# Patient Record
Sex: Female | Born: 1955 | Race: Black or African American | Hispanic: No | Marital: Single | Smoking: Former smoker
Health system: Southern US, Community
[De-identification: ages and names within clinical notes are randomized; demographics above are authoritative.]

## PROBLEM LIST (undated history)

## (undated) DIAGNOSIS — E119 Type 2 diabetes mellitus without complications: Secondary | ICD-10-CM

## (undated) DIAGNOSIS — E079 Disorder of thyroid, unspecified: Secondary | ICD-10-CM

## (undated) DIAGNOSIS — J45909 Unspecified asthma, uncomplicated: Secondary | ICD-10-CM

## (undated) DIAGNOSIS — E7800 Pure hypercholesterolemia, unspecified: Secondary | ICD-10-CM

## (undated) HISTORY — PX: BARTHOLIN GLAND CYST EXCISION: SHX565

---

## 2018-07-09 ENCOUNTER — Emergency Department (HOSPITAL_COMMUNITY)
Admission: EM | Admit: 2018-07-09 | Discharge: 2018-07-09 | Disposition: A | Discharge status: Home / Self Care | Payer: BLUE CROSS/BLUE SHIELD | Attending: Emergency Medicine | Admitting: Emergency Medicine

## 2018-07-09 ENCOUNTER — Other Ambulatory Visit: Payer: Self-pay

## 2018-07-09 ENCOUNTER — Encounter (HOSPITAL_COMMUNITY): Payer: Self-pay | Admitting: Emergency Medicine

## 2018-07-09 DIAGNOSIS — S61411A Laceration without foreign body of right hand, initial encounter: Secondary | ICD-10-CM | POA: Insufficient documentation

## 2018-07-09 DIAGNOSIS — E119 Type 2 diabetes mellitus without complications: Secondary | ICD-10-CM | POA: Insufficient documentation

## 2018-07-09 DIAGNOSIS — Y92000 Kitchen of unspecified non-institutional (private) residence as  the place of occurrence of the external cause: Secondary | ICD-10-CM | POA: Diagnosis not present

## 2018-07-09 DIAGNOSIS — Y939 Activity, unspecified: Secondary | ICD-10-CM | POA: Insufficient documentation

## 2018-07-09 DIAGNOSIS — W268XXA Contact with other sharp object(s), not elsewhere classified, initial encounter: Secondary | ICD-10-CM | POA: Diagnosis not present

## 2018-07-09 DIAGNOSIS — Y999 Unspecified external cause status: Secondary | ICD-10-CM | POA: Insufficient documentation

## 2018-07-09 DIAGNOSIS — Z23 Encounter for immunization: Secondary | ICD-10-CM | POA: Diagnosis not present

## 2018-07-09 HISTORY — DX: Type 2 diabetes mellitus without complications: E11.9

## 2018-07-09 HISTORY — DX: Disorder of thyroid, unspecified: E07.9

## 2018-07-09 HISTORY — DX: Pure hypercholesterolemia, unspecified: E78.00

## 2018-07-09 MED ORDER — LIDOCAINE HCL (PF) 2 % IJ SOLN
INTRAMUSCULAR | Status: AC
  Administered 2018-07-09: 22:00:00
  Filled 2018-07-09: qty 10

## 2018-07-09 MED ORDER — TETANUS-DIPHTH-ACELL PERTUSSIS 5-2.5-18.5 LF-MCG/0.5 IM SUSP
0.5000 mL | Freq: Once | INTRAMUSCULAR | Status: AC
  Administered 2018-07-09: 0.5 mL via INTRAMUSCULAR
  Filled 2018-07-09: qty 0.5

## 2018-07-09 NOTE — ED Provider Notes (Signed)
[facility] EMERGENCY DEPARTMENT Provider Note   CSN: [id number] Arrival date & time: 07/09/18  2037    History   Chief Complaint Chief Complaint  Patient presents with  . Laceration    HPI Dominique Snyder is a 63 y.o. female.     The history is provided by the patient.  Laceration  Location:  Hand Hand laceration location:  R hand Length:  3 cm Depth:  Through dermis Quality: straight   Bleeding: controlled   Time since incident:  1 hour Laceration mechanism:  Metal edge (Patient was attempting to open a can of beans using an old-fashioned can opener and cut her hand.) Pain details:    Quality:  Dull   Severity:  Mild   Timing:  Constant Foreign body present:  No foreign bodies Relieved by:  Nothing Worsened by:  Movement Tetanus status:  Out of date Associated symptoms: no fever, no focal weakness, no numbness and no swelling     Past Medical History:  Diagnosis Date  . Diabetes mellitus without complication (HCC)   . High cholesterol   . Thyroid disease     There are no active problems to display for this patient.   Past Surgical History:  Procedure Laterality Date  . BARTHOLIN GLAND CYST EXCISION       OB History   No obstetric history on file.      Home Medications    Prior to Admission medications   Not on File    Family History History reviewed. No pertinent family history.  Social History Social History   Tobacco Use  . Smoking status: Never Smoker  . Smokeless tobacco: Never Used  Substance Use Topics  . Alcohol use: Never    Frequency: Never  . Drug use: Never     Allergies   Strawberry (diagnostic)   Review of Systems Review of Systems  Constitutional: Negative for chills and fever.  Skin: Positive for wound.  Neurological: Negative for focal weakness, weakness and numbness.     Physical Exam Updated Vital Signs BP (!) 179/105 (BP Location: Left Arm)   Pulse 83   Temp 98.4 F (36.9 C) (Oral)   Resp 17    Ht 5\' 6"  (1.676 m)   Wt 70.8 kg   SpO2 98%   BMI 25.18 kg/m   Physical Exam Constitutional:      Appearance: She is well-developed.  HENT:     Head: Normocephalic.  Cardiovascular:     Rate and Rhythm: Normal rate.  Pulmonary:     Effort: Pulmonary effort is normal.  Musculoskeletal:        General: No tenderness.     Comments: Full range of motion of affected fingers of right hand.  Skin:    Findings: Laceration present.     Comments: 3 cm laceration dorsal right hand involving the proximal ring finger and distal hand.  Wound is hemostatic.  It is through the dermis, no visualized deep structures.  Neurological:     Mental Status: She is alert and oriented to person, place, and time.     Sensory: No sensory deficit.     Comments: Distal sensation is intact in fingertips.      ED Treatments / Results  Labs (all labs ordered are listed, but only abnormal results are displayed) Labs Reviewed - No data to display  EKG None  Radiology No results found.  Procedures Procedures (including critical care time)  LACERATION REPAIR Performed by: Burgess Amor Authorized by:  Burgess Amor Consent: Verbal consent obtained. Risks and benefits: risks, benefits and alternatives were discussed Consent given by: patient Patient identity confirmed: provided demographic data Prepped and Draped in normal sterile fashion Wound explored  Laceration Location: right hand  Laceration Length: 3cm  No Foreign Bodies seen or palpated  Anesthesia: local infiltration  Local anesthetic: lidocaine 2% without epinephrine  Anesthetic total: 3 ml  Irrigation method: syringe Amount of cleaning: standard  Skin closure: ethilon 4-0  Number of sutures: 6  Technique: simple interrupted  Patient tolerance: Patient tolerated the procedure well with no immediate complications.   Medications Ordered in ED Medications  lidocaine (XYLOCAINE) 2 % injection (has no administration in time  range)  Tdap (BOOSTRIX) injection 0.5 mL (has no administration in time range)     Initial Impression / Assessment and Plan / ED Course  I have reviewed the triage vital signs and the nursing notes.  Pertinent labs & imaging results that were available during my care of the patient were reviewed by me and considered in my medical decision making (see chart for details).        Wound care instructions given.  Pt advised to have sutures removed in 10 days,  Return here sooner for any signs of infection including redness, swelling, worse pain or drainage of pus.     Final Clinical Impressions(s) / ED Diagnoses   Final diagnoses:  Laceration of right hand without foreign body, initial encounter    ED Discharge Orders    None       Victoriano Lain 07/09/18 2215    Eber Hong, MD 07/09/18 [phone]

## 2018-07-09 NOTE — ED Notes (Signed)
edp in room  

## 2018-07-09 NOTE — Discharge Instructions (Addendum)
Have your sutures removed in 10 days.  Keep your wound clean and dry,  Until a good scab forms - you may then wash gently twice daily with mild soap and water, but dry completely after.  Get rechecked for any sign of infection (redness,  Swelling,  Increased pain or drainage of purulent fluid). ° °

## 2018-07-09 NOTE — ED Triage Notes (Signed)
Patient states she cut her middle right finger on an old can opener tonight. Unknown last tetanus shot.

## 2019-08-02 ENCOUNTER — Ambulatory Visit: Payer: BC Managed Care – PPO | Attending: Internal Medicine

## 2019-08-02 DIAGNOSIS — Z23 Encounter for immunization: Secondary | ICD-10-CM

## 2019-08-02 NOTE — Progress Notes (Signed)
   Covid-19 Vaccination Clinic  Name:  Dominique Snyder    MRN: [id number] DOB: 10/02/1955  08/02/2019  [name] was observed post Covid-19 immunization for 15 minutes without incident. She was provided with Vaccine Information Sheet and instruction to access the V-Safe system.   [name] was instructed to call 911 with any severe reactions post vaccine: Marland Kitchen Difficulty breathing  . Swelling of face and throat  . A fast heartbeat  . A bad rash all over body  . Dizziness and weakness   Immunizations Administered    Name Date Dose VIS Date Route   Moderna COVID-19 Vaccine 08/02/2019  9:07 AM 0.5 mL 03/25/2019 Intramuscular   Manufacturer: Moderna   Lot: [id number]   NDC: 69996-722-77

## 2019-08-30 ENCOUNTER — Ambulatory Visit: Payer: BC Managed Care – PPO | Attending: Internal Medicine

## 2019-08-30 DIAGNOSIS — Z23 Encounter for immunization: Secondary | ICD-10-CM

## 2019-08-30 NOTE — Progress Notes (Signed)
   Covid-19 Vaccination Clinic  Name:  Dominique Snyder    MRN: [id number] DOB: 1956-04-11  08/30/2019  Dominique Snyder was observed post Covid-19 immunization for 15 minutes without incident. She was provided with Vaccine Information Sheet and instruction to access the V-Safe system.   Dominique Snyder was instructed to call 911 with any severe reactions post vaccine: Marland Kitchen Difficulty breathing  . Swelling of face and throat  . A fast heartbeat  . A bad rash all over body  . Dizziness and weakness   Immunizations Administered    Name Date Dose VIS Date Route   Moderna COVID-19 Vaccine 08/30/2019  9:50 AM 0.5 mL 03/2019 Intramuscular   Manufacturer: Moderna   Lot: [id number]   NDC: 89791-504-13

## 2019-10-05 ENCOUNTER — Other Ambulatory Visit: Payer: Self-pay

## 2019-10-05 ENCOUNTER — Emergency Department (HOSPITAL_COMMUNITY)
Admission: EM | Admit: 2019-10-05 | Discharge: 2019-10-06 | Disposition: A | Discharge status: Home / Self Care | Payer: BC Managed Care – PPO | Attending: Emergency Medicine | Admitting: Emergency Medicine

## 2019-10-05 ENCOUNTER — Emergency Department (HOSPITAL_COMMUNITY): Payer: BC Managed Care – PPO

## 2019-10-05 DIAGNOSIS — Z7902 Long term (current) use of antithrombotics/antiplatelets: Secondary | ICD-10-CM | POA: Insufficient documentation

## 2019-10-05 DIAGNOSIS — M549 Dorsalgia, unspecified: Secondary | ICD-10-CM | POA: Diagnosis not present

## 2019-10-05 DIAGNOSIS — R0789 Other chest pain: Secondary | ICD-10-CM | POA: Diagnosis present

## 2019-10-05 DIAGNOSIS — Z79899 Other long term (current) drug therapy: Secondary | ICD-10-CM | POA: Diagnosis not present

## 2019-10-05 DIAGNOSIS — Z794 Long term (current) use of insulin: Secondary | ICD-10-CM | POA: Insufficient documentation

## 2019-10-05 DIAGNOSIS — E119 Type 2 diabetes mellitus without complications: Secondary | ICD-10-CM | POA: Insufficient documentation

## 2019-10-05 LAB — BASIC METABOLIC PANEL
Anion gap: 10 (ref 5–15)
BUN: 14 mg/dL (ref 8–23)
CO2: 29 mmol/L (ref 22–32)
Calcium: 9.7 mg/dL (ref 8.9–10.3)
Chloride: 100 mmol/L (ref 98–111)
Creatinine, Ser: 0.74 mg/dL (ref 0.44–1.00)
GFR calc Af Amer: >60 mL/min (ref >60)
GFR calc non Af Amer: >60 mL/min (ref >60)
Glucose, Bld: 184 mg/dL — ABNORMAL HIGH (ref 70–99)
Potassium: 3.7 mmol/L (ref 3.5–5.1)
Sodium: 139 mmol/L (ref 135–145)

## 2019-10-05 LAB — TROPONIN I (HIGH SENSITIVITY): Troponin I (High Sensitivity): 7 ng/L (ref <18)

## 2019-10-05 LAB — CBC
HCT: 40.7 % (ref 36.0–46.0)
Hemoglobin: 13.3 g/dL (ref 12.0–15.0)
MCH: 28.9 pg (ref 26.0–34.0)
MCHC: 32.7 g/dL (ref 30.0–36.0)
MCV: 88.5 fL (ref 80.0–100.0)
Platelets: 335 10*3/uL (ref 150–400)
RBC: 4.6 MIL/uL (ref 3.87–5.11)
RDW: 13.9 % (ref 11.5–15.5)
WBC: 8.8 10*3/uL (ref 4.0–10.5)
nRBC: 0 % (ref 0.0–0.2)

## 2019-10-05 MED ORDER — FENTANYL CITRATE (PF) 100 MCG/2ML IJ SOLN
50.0000 ug | Freq: Once | INTRAMUSCULAR | Status: AC
  Administered 2019-10-05: 50 ug via INTRAVENOUS
  Filled 2019-10-05: qty 2

## 2019-10-05 NOTE — ED Triage Notes (Signed)
Patient states chest pain started today while she was at home and states pain radiates to her arm and back.

## 2019-10-06 ENCOUNTER — Emergency Department (HOSPITAL_COMMUNITY): Payer: BC Managed Care – PPO

## 2019-10-06 LAB — TROPONIN I (HIGH SENSITIVITY): Troponin I (High Sensitivity): 6 ng/L (ref <18)

## 2019-10-06 MED ORDER — IOHEXOL 350 MG/ML SOLN
100.0000 mL | Freq: Once | INTRAVENOUS | Status: AC | PRN
  Administered 2019-10-06: 100 mL via INTRAVENOUS

## 2019-10-06 MED ORDER — SIMETHICONE 80 MG PO CHEW
80.0000 mg | CHEWABLE_TABLET | Freq: Once | ORAL | Status: AC
  Administered 2019-10-06: 80 mg via ORAL
  Filled 2019-10-06: qty 1

## 2019-10-06 NOTE — ED Provider Notes (Signed)
[facility] EMERGENCY DEPARTMENT Provider Note   CSN: [id number] Arrival date & time: 10/05/19  2208   Time seen 11:40  Chief Complaint  Patient presents with  . Chest Pain    Dominique Snyder is a 64 y.o. female.  HPI     Patient states this evening about 8 PM she was watching TV and she started getting pain in her lower central chest that radiates into her upper back bilaterally.  She describes the discomfort as tightness.  She states sometimes changing positions such as leaning forward makes it hurt more.  However if she leans forward and lays across a pillow that makes it feel better.  The pain is been there constantly although it is better now, she states it is currently a 7/10.  She denies shortness of breath but states she had nausea and some diaphoresis without vomiting.  The pain does not radiate into her arms.  She states she had it about a week ago and thought it was from gas.  She states she walked around until she had flatus and it felt better.  She tried that tonight without improvement.  She states she started having a cough with some white mucus production after the pain started.  She denies fever, sore throat, or rhinorrhea.  She has a mild frontal headache that she describes as tension.  She denies abdominal bloating or acid reflux symptoms such as burning fluid in her throat.  She states for dinner tonight she ate pork chops, lima beans, mashed potatoes and gravy, and cornbread.  For family history she states her maternal grandmother had an MI, her maternal aunt had chest pains.  Patient has history of diabetes and high cholesterol but denies hypertension.  She does not smoke.  PCP Lenoria Chime, FNP     Past Medical History:  Diagnosis Date  . Diabetes mellitus without complication (HCC)   . High cholesterol   . Thyroid disease     There are no problems to display for this patient.   Past Surgical History:  Procedure Laterality Date  . BARTHOLIN GLAND CYST  EXCISION       OB History   No obstetric history on file.     No family history on file.  Social History   Tobacco Use  . Smoking status: Never Smoker  . Smokeless tobacco: Never Used  Substance Use Topics  . Alcohol use: Never  . Drug use: Never  employed in factory  Home Medications Prior to Admission medications   Medication Sig Start Date End Date Taking? Authorizing Provider  atorvastatin (LIPITOR) 20 MG tablet atorvastatin 20 mg tablet   Yes [provider]  insulin glargine (LANTUS SOLOSTAR) 100 UNIT/ML Solostar Pen Lantus Solostar U-100 Insulin 100 unit/mL (3 mL) subcutaneous pen  INJECT 10 UNITS ONCE A DAY SUBCUTANEOUS   Yes [provider]  levothyroxine (SYNTHROID) 100 MCG tablet levothyroxine 100 mcg tablet   Yes [provider]  metFORMIN (GLUCOPHAGE) 500 MG tablet metformin 500 mg tablet   Yes [provider]  nabumetone (RELAFEN) 750 MG tablet nabumetone 750 mg tablet   Yes [provider]  omeprazole (PRILOSEC) 20 MG capsule omeprazole 20 mg capsule,delayed release   Yes [provider]  sitaGLIPtin (JANUVIA) 100 MG tablet Januvia 100 mg tablet  TAKE 1 TABLET BY MOUTH EVERY DAY   Yes [provider]  albuterol (VENTOLIN HFA) 108 (90 Base) MCG/ACT inhaler albuterol sulfate HFA 90 mcg/actuation aerosol inhaler  [provider]    Allergies    Strawberry (diagnostic)  Review of Systems   Review of Systems  All other systems reviewed and are negative.   Physical Exam Updated Vital Signs BP 130/83   Pulse 65   Temp 98.5 F (36.9 C) (Oral)   Resp 12   Ht 5\' 6"  (1.676 m)   Wt 82.6 kg   SpO2 97%   BMI 29.38 kg/m   Physical Exam Vitals and nursing note reviewed.  Constitutional:      Appearance: Normal appearance. She is normal weight.     Comments: Watching TV in no distress  HENT:     Head: Normocephalic and atraumatic.     Right Ear: External ear normal.     Left Ear:  External ear normal.     Nose: Nose normal.  Eyes:     Extraocular Movements: Extraocular movements intact.     Conjunctiva/sclera: Conjunctivae normal.     Pupils: Pupils are equal, round, and reactive to light.  Cardiovascular:     Rate and Rhythm: Normal rate.     Pulses: Normal pulses.     Heart sounds: No murmur heard.   Pulmonary:     Effort: Pulmonary effort is normal. No respiratory distress.     Breath sounds: Normal breath sounds.  Chest:     Chest wall: Tenderness present.    Abdominal:     General: Abdomen is flat. Bowel sounds are normal.     Palpations: Abdomen is soft.  Musculoskeletal:        General: Normal range of motion.     Cervical back: Normal range of motion.       Back:     Comments: Area that her back pain radiates to is noted however it is nontender.  Skin:    General: Skin is warm and dry.  Neurological:     General: No focal deficit present.     Mental Status: She is alert and oriented to person, place, and time.     Cranial Nerves: No cranial nerve deficit.  Psychiatric:        Mood and Affect: Mood normal.        Behavior: Behavior normal.        Thought Content: Thought content normal.     ED Results / Procedures / Treatments   Labs (all labs ordered are listed, but only abnormal results are displayed) Results for orders placed or performed during the hospital encounter of 10/05/19  CBC  Result Value Ref Range   WBC 8.8 4.0 - 10.5 K/uL   RBC 4.60 3.87 - 5.11 MIL/uL   Hemoglobin 13.3 12.0 - 15.0 g/dL   HCT 40.7 36 - 46 %   MCV 88.5 80.0 - 100.0 fL   MCH 28.9 26.0 - 34.0 pg   MCHC 32.7 30.0 - 36.0 g/dL   RDW 13.9 11.5 - 15.5 %   Platelets 335 150 - 400 K/uL   nRBC 0.0 0.0 - 0.2 %  Basic metabolic panel  Result Value Ref Range   Sodium 139 135 - 145 mmol/L   Potassium 3.7 3.5 - 5.1 mmol/L   Chloride 100 98 - 111 mmol/L   CO2 29 22 - 32 mmol/L   Glucose, Bld 184 (H) 70 - 99 mg/dL   BUN 14 8 - 23 mg/dL   Creatinine, Ser 0.74  0.44 - 1.00 mg/dL   Calcium 9.7 8.9 - 10.3 mg/dL   GFR calc non Af Amer >  60 >60 mL/min   GFR calc Af Amer >60 >60 mL/min   Anion gap 10 5 - 15  Troponin I (High Sensitivity)  Result Value Ref Range   Troponin I (High Sensitivity) 7 <18 ng/L  Troponin I (High Sensitivity)  Result Value Ref Range   Troponin I (High Sensitivity) 6 <18 ng/L   Laboratory interpretation all normal with negative delta troponin    EKG EKG Interpretation  Date/Time:  Sunday October 05 2019 22:23:28 EDT Ventricular Rate:  78 PR Interval:    QRS Duration: 87 QT Interval:  398 QTC Calculation: 454 R Axis:   62 Text Interpretation: Sinus rhythm Electrode noise Baseline wander Poor R wave progression No old tracing to compare Confirmed by Devoria Albe ([id number]) on 10/05/2019 11:07:13 PM   Radiology CT Angio Chest PE W/Cm &/Or Wo Cm  Result Date: 10/06/2019 CLINICAL DATA:  Chest pain EXAM: CT ANGIOGRAPHY CHEST WITH CONTRAST TECHNIQUE: Multidetector CT imaging of the chest was performed using the standard protocol during bolus administration of intravenous contrast. Multiplanar CT image reconstructions and MIPs were obtained to evaluate the vascular anatomy. CONTRAST:  OMNIPAQUE IOHEXOL 350 MG/ML SOLN COMPARISON:  Chest x-ray 10/05/2019 FINDINGS: Cardiovascular: No filling defects in the pulmonary arteries to suggest pulmonary emboli. Heart is normal size. Aorta is normal caliber. Mediastinum/Nodes: No mediastinal, hilar, or axillary adenopathy. Trachea and esophagus are unremarkable. Thyroid unremarkable. Lungs/Pleura: Lungs are clear. No focal airspace opacities or suspicious nodules. No effusions. Upper Abdomen: Low-density lesion with peripheral contrast puddling in the right hepatic lobe compatible with hemangioma measuring 2.9 cm. No acute findings. Musculoskeletal: Chest wall soft tissues are unremarkable. No acute bony abnormality. Review of the MIP images confirms the above findings. IMPRESSION: No evidence of  pulmonary embolus. No acute cardiopulmonary disease. 2.9 cm right hepatic hemangioma. Electronically Signed   By: Charlett Nose M.D.   On: 10/06/2019 00:37   DG Chest Portable 1 View  Result Date: 10/05/2019 CLINICAL DATA:  Chest pain EXAM: PORTABLE CHEST 1 VIEW COMPARISON:  None. FINDINGS: The heart size and mediastinal contours are within normal limits. Both lungs are clear. The visualized skeletal structures are unremarkable. IMPRESSION: No active disease. Electronically Signed   By: Katherine Mantle M.D.   On: 10/05/2019 23:11    Procedures Procedures (including critical care time)  Medications Ordered in ED Medications  simethicone (MYLICON) chewable tablet 80 mg (has no administration in time range)  fentaNYL (SUBLIMAZE) injection 50 mcg (50 mcg Intravenous Given 10/05/19 2354)  iohexol (OMNIPAQUE) 350 MG/ML injection 100 mL (100 mLs Intravenous Contrast Given 10/06/19 0018)    ED Course  I have reviewed the triage vital signs and the nursing notes.  Pertinent labs & imaging results that were available during my care of the patient were reviewed by me and considered in my medical decision making (see chart for details).    MDM Rules/Calculators/A&P                          Patient was given fentanyl for her pain.  Due to her having chest pain that radiates into her back CTA was done.  Recheck after patient CT had resulted.  Patient is watching TV in no distress.  She states she still has pain after the fentanyl.  She was given Mylicon for gas.  We discussed increasing her omeprazole to twice a day for the next 2 weeks and then dropping back down to once a day.  At this  point her pain is atypical, she has ruled out for an acute myocardial ischemia tonight.  She should be followed up by her primary care doctor if she continues to have problems and she may need GI referral.  Final Clinical Impression(s) / ED Diagnoses Final diagnoses:  Atypical chest pain    Rx / DC Orders ED  Discharge Orders    None    OTC gaviscon or maalox  Plan discharge  Devoria Albe, MD, Concha Pyo, MD 10/06/19 0330

## 2019-10-06 NOTE — Discharge Instructions (Addendum)
Increase your omeprazole to twice a day for the next 2 weeks then drop it back down to once a day.  Try to avoid eating fried, spicy, or greasy foods for the next couple of weeks.  If you continue to get the pain, try taking Maalox or Gaviscon for the discomfort.  You might need to see a gastroenterologist if you continue to have the pain.  Return to the emergency department if your pain gets severe.

## 2020-08-13 IMAGING — CT CT ANGIO CHEST
2 of 6 series · 19 of 46 positions shown · IV contrast (omnipaque)
Comparison: Chest x-ray 10/05/2019

CLINICAL DATA: Chest pain

EXAM:
CT ANGIOGRAPHY CHEST WITH CONTRAST
TECHNIQUE: Multidetector CT imaging of the chest was performed using the
standard protocol during bolus administration of intravenous
contrast. Multiplanar CT image reconstructions and MIPs were
obtained to evaluate the vascular anatomy.
CONTRAST:  100mL OMNIPAQUE IOHEXOL 350 MG/ML SOLN

[Series 5: pe axial thins · axial · 0.67mm/px · z∈[-319,-67]mm · 16 of 278 slices shown]
[im 13/278  lung]
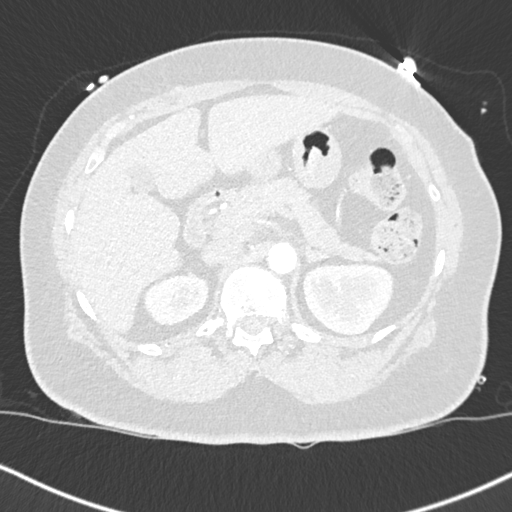
[im 37/278  soft-tissue]
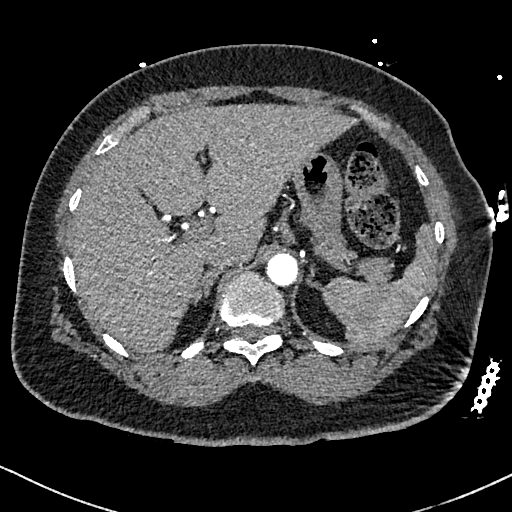
[im 49/278  lung]
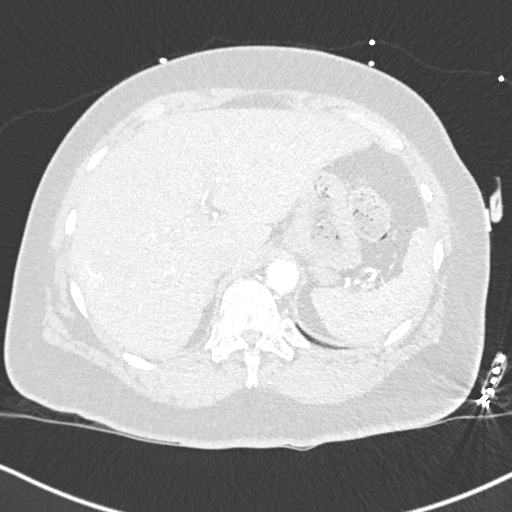
[im 61/278  soft-tissue]
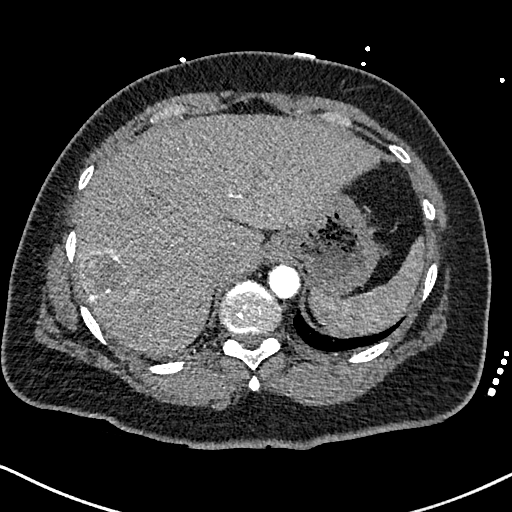
[im 85/278  lung]
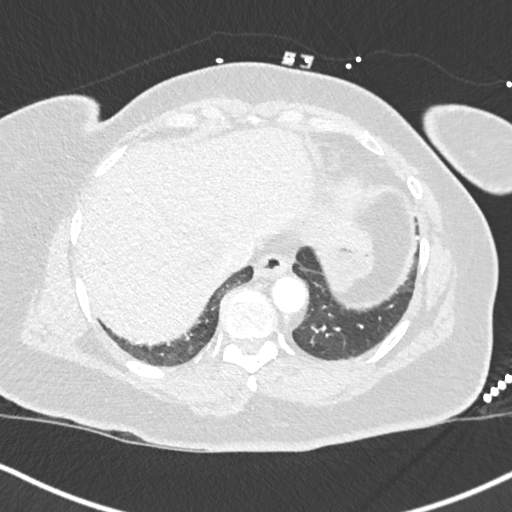
[im 97/278  soft-tissue]
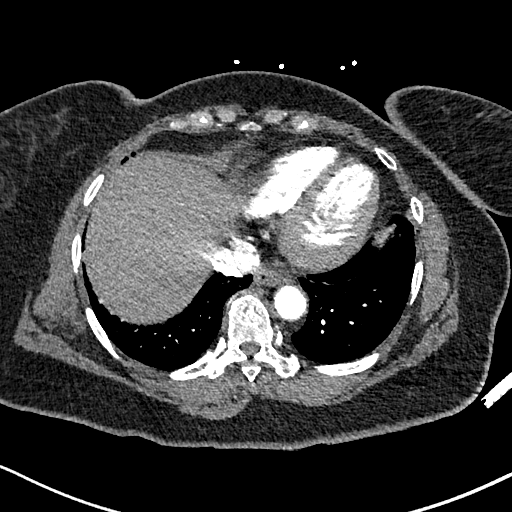
[im 109/278  lung]
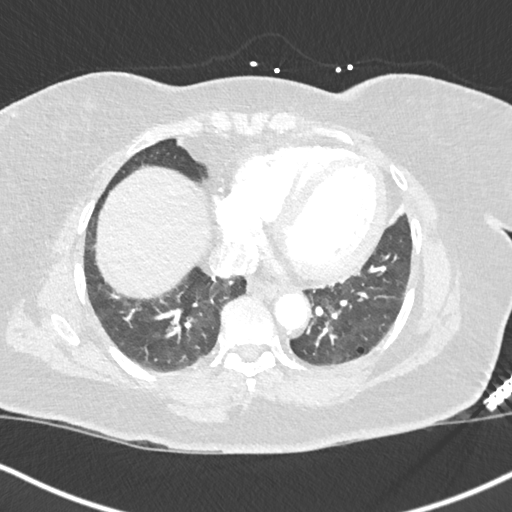
[im 133/278  soft-tissue]
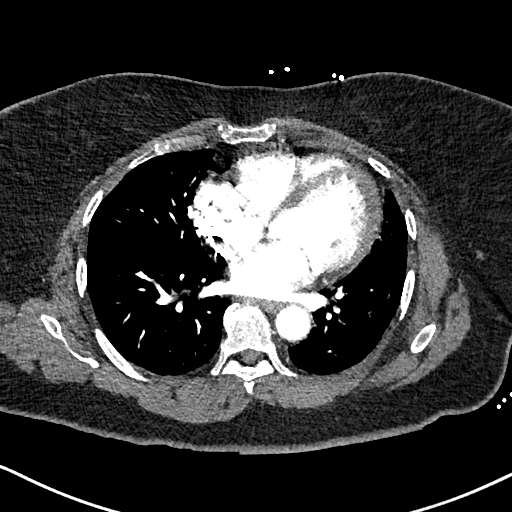
[im 145/278  lung]
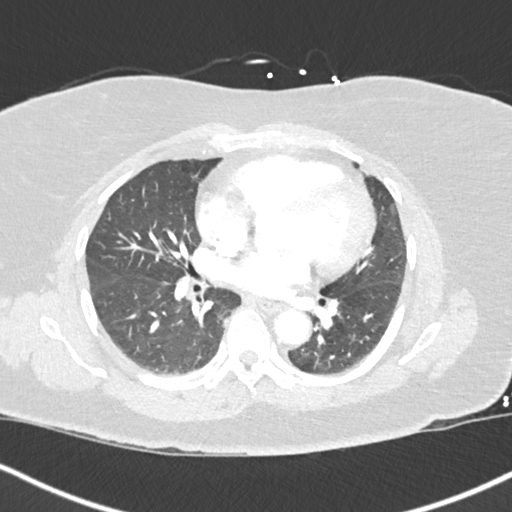
[im 169/278  soft-tissue]
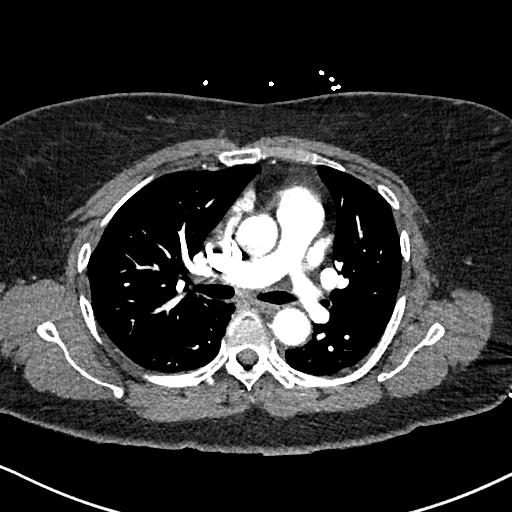
[im 181/278  lung]
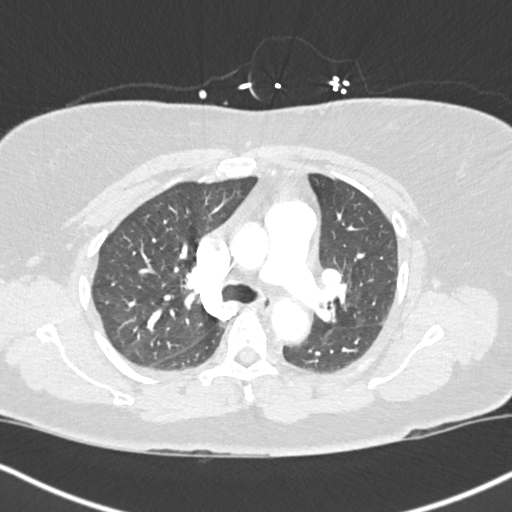
[im 193/278  soft-tissue]
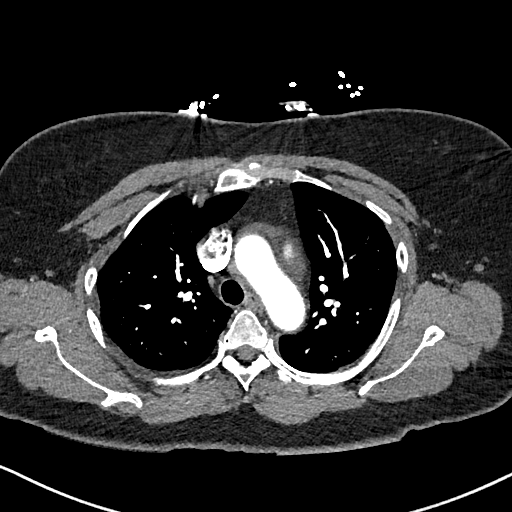
[im 217/278  lung]
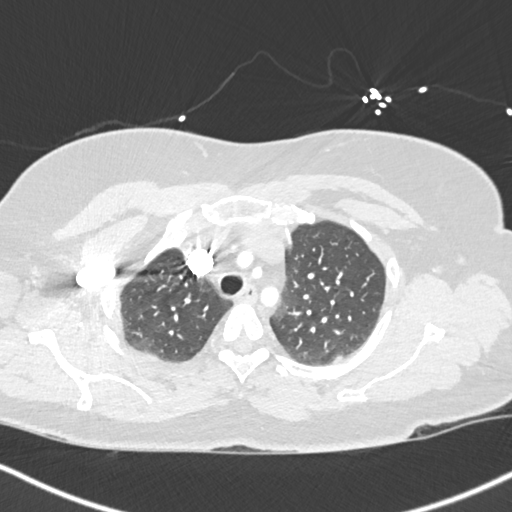
[im 229/278  soft-tissue]
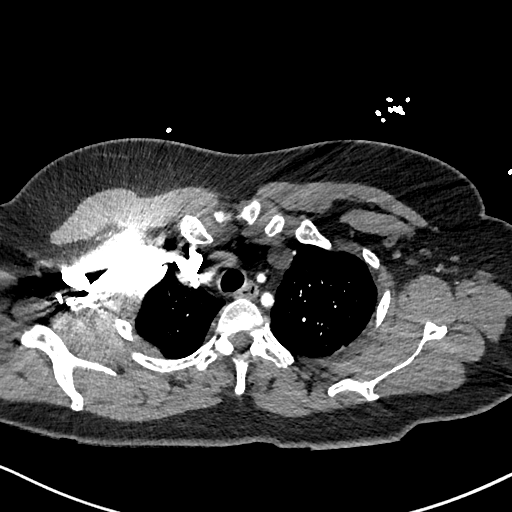
[im 241/278  lung]
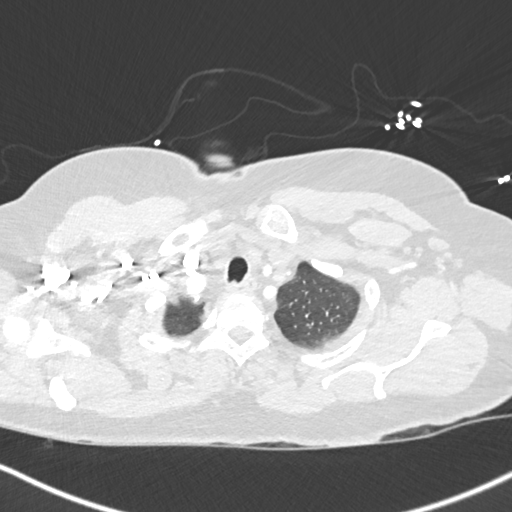
[im 265/278  soft-tissue]
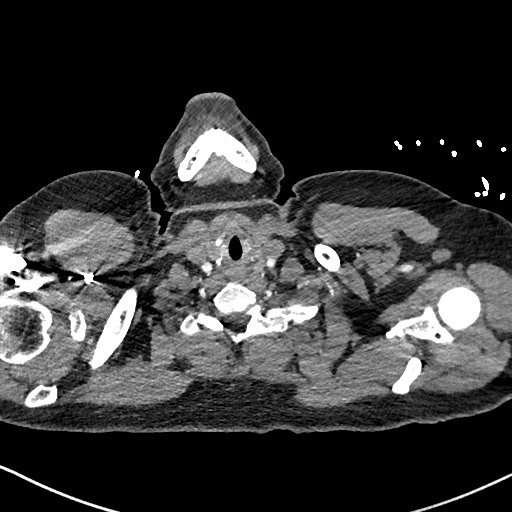

[Series 7: cor soft · coronal · 0.56mm/px · 3 of 137 slices shown]
[im 35/137  soft-tissue]
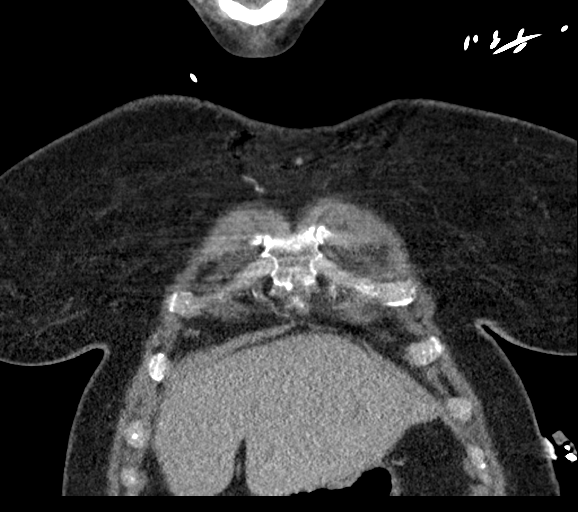
[im 69/137  soft-tissue]
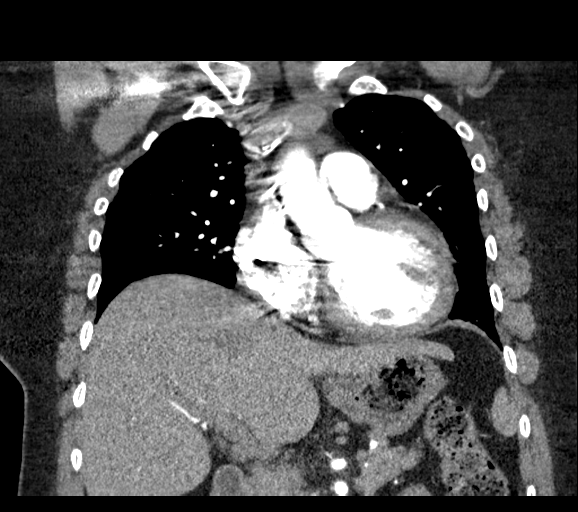
[im 103/137  soft-tissue]
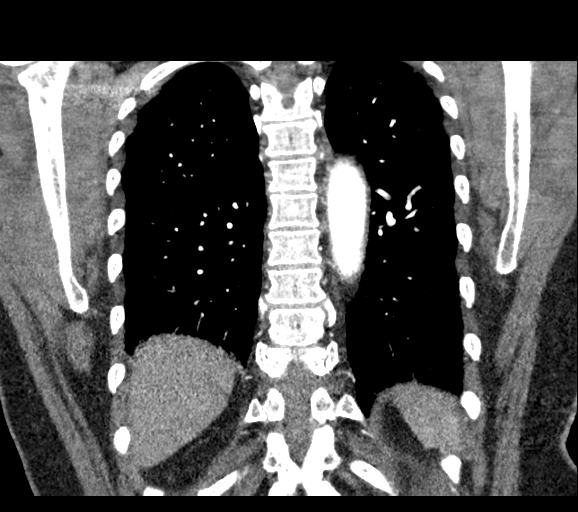

[19 of 46 positions shown; findings below may reference images not displayed]

FINDINGS: Cardiovascular: No filling defects in the pulmonary arteries to
suggest pulmonary emboli. Heart is normal size. Aorta is normal
caliber.

Mediastinum/Nodes: No mediastinal, hilar, or axillary adenopathy.
Trachea and esophagus are unremarkable. Thyroid unremarkable.

Lungs/Pleura: Lungs are clear. No focal airspace opacities or
suspicious nodules. No effusions.

Upper Abdomen: Low-density lesion with peripheral contrast puddling
in the right hepatic lobe compatible with hemangioma measuring
cm. No acute findings.

Musculoskeletal: Chest wall soft tissues are unremarkable. No acute
bony abnormality.

Review of the MIP images confirms the above findings.
IMPRESSION: No evidence of pulmonary embolus.

No acute cardiopulmonary disease.

2.9 cm right hepatic hemangioma.

## 2021-03-13 ENCOUNTER — Other Ambulatory Visit: Payer: Self-pay

## 2021-03-13 ENCOUNTER — Emergency Department (HOSPITAL_COMMUNITY)
Admission: EM | Admit: 2021-03-13 | Discharge: 2021-03-13 | Disposition: A | Discharge status: Home / Self Care | Payer: BC Managed Care – PPO | Attending: Emergency Medicine | Admitting: Emergency Medicine

## 2021-03-13 DIAGNOSIS — Z794 Long term (current) use of insulin: Secondary | ICD-10-CM | POA: Diagnosis not present

## 2021-03-13 DIAGNOSIS — U071 COVID-19: Secondary | ICD-10-CM | POA: Insufficient documentation

## 2021-03-13 DIAGNOSIS — R059 Cough, unspecified: Secondary | ICD-10-CM | POA: Diagnosis present

## 2021-03-13 DIAGNOSIS — E119 Type 2 diabetes mellitus without complications: Secondary | ICD-10-CM | POA: Insufficient documentation

## 2021-03-13 DIAGNOSIS — R040 Epistaxis: Secondary | ICD-10-CM | POA: Insufficient documentation

## 2021-03-13 DIAGNOSIS — Z7984 Long term (current) use of oral hypoglycemic drugs: Secondary | ICD-10-CM | POA: Diagnosis not present

## 2021-03-13 LAB — RESP PANEL BY RT-PCR (FLU A&B, COVID) ARPGX2
Influenza A by PCR: NEGATIVE
Influenza B by PCR: NEGATIVE
SARS Coronavirus 2 by RT PCR: POSITIVE — AB

## 2021-03-13 MED ORDER — LIDOCAINE VISCOUS HCL 2 % MT SOLN
15.0000 mL | Freq: Once | OROMUCOSAL | Status: AC
  Administered 2021-03-13: 15 mL via OROMUCOSAL
  Filled 2021-03-13: qty 15

## 2021-03-13 NOTE — ED Triage Notes (Signed)
Sore throat and nasal congestion with intermittent nose bleeds x 1 day. Nose currently not bleeding.

## 2021-03-13 NOTE — ED Provider Notes (Signed)
[facility] EMERGENCY DEPARTMENT Provider Note   CSN: [id number] Arrival date & time: 03/13/21  2024     History Chief Complaint  Patient presents with   Epistaxis   Sore Throat    Sherrol Vicars is a 65 y.o. female.  With past medical history of diabetes who presents emergency department with sore throat and epistaxis.  She states that beginning Tuesday she began having sore throat, nonproductive cough and nasal congestion.  She states that on Thursday she began having a nosebleed and it has been intermittent since then.  She denies any fevers, chest pain or shortness of breath, palpitations, nausea, vomiting or diarrhea..  She states that her son was sick last week but is unsure if he was diagnosed with flu, COVID or other.   Epistaxis Associated symptoms: congestion, cough and sore throat   Associated symptoms: no fever   Sore Throat Pertinent negatives include no chest pain and no shortness of breath.      Past Medical History:  Diagnosis Date   Diabetes mellitus without complication (HCC)    High cholesterol    Thyroid disease     There are no problems to display for this patient.   Past Surgical History:  Procedure Laterality Date   BARTHOLIN GLAND CYST EXCISION       OB History   No obstetric history on file.     No family history on file.  Social History   Tobacco Use   Smoking status: Never   Smokeless tobacco: Never  Substance Use Topics   Alcohol use: Never   Drug use: Never    Home Medications Prior to Admission medications   Medication Sig Start Date End Date Taking? Authorizing Provider  albuterol (VENTOLIN HFA) 108 (90 Base) MCG/ACT inhaler albuterol sulfate HFA 90 mcg/actuation aerosol inhaler    [provider]  atorvastatin (LIPITOR) 20 MG tablet atorvastatin 20 mg tablet    [provider]  insulin glargine (LANTUS SOLOSTAR) 100 UNIT/ML Solostar Pen Lantus Solostar U-100 Insulin 100 unit/mL (3 mL) subcutaneous  pen  INJECT 10 UNITS ONCE A DAY SUBCUTANEOUS    [provider]  levothyroxine (SYNTHROID) 100 MCG tablet levothyroxine 100 mcg tablet    [provider]  metFORMIN (GLUCOPHAGE) 500 MG tablet metformin 500 mg tablet    [provider]  nabumetone (RELAFEN) 750 MG tablet nabumetone 750 mg tablet    [provider]  omeprazole (PRILOSEC) 20 MG capsule omeprazole 20 mg capsule,delayed release    [provider]  sitaGLIPtin (JANUVIA) 100 MG tablet Januvia 100 mg tablet  TAKE 1 TABLET BY MOUTH EVERY DAY    [provider]    Allergies    Strawberry (diagnostic)  Review of Systems   Review of Systems  Constitutional:  Negative for fever.  HENT:  Positive for congestion, nosebleeds, rhinorrhea and sore throat.   Respiratory:  Positive for cough. Negative for shortness of breath.   Cardiovascular:  Negative for chest pain.  Gastrointestinal:  Negative for nausea and vomiting.   Physical Exam Updated Vital Signs BP (!) 158/89   Pulse (!) 119   Temp 99.3 F (37.4 C) (Oral)   Ht 5\' 6"  (1.676 m)   Wt 82.6 kg   SpO2 94%   BMI 29.38 kg/m   Physical Exam Vitals and nursing note reviewed.  Constitutional:      General: She is not in acute distress.    Appearance: She is well-developed. She is not ill-appearing or toxic-appearing.  HENT:     Head: Normocephalic and atraumatic.     Nose: Congestion present.     Mouth/Throat:     Mouth: Mucous membranes are moist.     Pharynx: Uvula midline. Posterior oropharyngeal erythema present. No uvula swelling.     Tonsils: No tonsillar exudate or tonsillar abscesses.  Eyes:     General: No scleral icterus.    Conjunctiva/sclera: Conjunctivae normal.  Cardiovascular:     Rate and Rhythm: Normal rate.     Heart sounds: Normal heart sounds. No murmur heard. Pulmonary:     Effort: Pulmonary effort is normal. No respiratory distress.     Breath sounds: Normal breath sounds.  Abdominal:      Palpations: Abdomen is soft.  Musculoskeletal:     Cervical back: Normal range of motion and neck supple.  Lymphadenopathy:     Cervical: Cervical adenopathy present.  Skin:    General: Skin is warm and dry.     Capillary Refill: Capillary refill takes less than 2 seconds.     Findings: No rash.  Neurological:     General: No focal deficit present.     Mental Status: She is alert.  Psychiatric:        Mood and Affect: Mood normal.        Behavior: Behavior normal.        Thought Content: Thought content normal.        Judgment: Judgment normal.    ED Results / Procedures / Treatments   Labs (all labs ordered are listed, but only abnormal results are displayed) Labs Reviewed  RESP PANEL BY RT-PCR (FLU A&B, COVID) ARPGX2 - Abnormal; Notable for the following components:      Result Value   SARS Coronavirus 2 by RT PCR POSITIVE (*)    All other components within normal limits   EKG None  Radiology No results found.  Procedures Procedures   Medications Ordered in ED Medications  lidocaine (XYLOCAINE) 2 % viscous mouth solution 15 mL (15 mLs Mouth/Throat Given 03/13/21 2206)    ED Course  I have reviewed the triage vital signs and the nursing notes.  Pertinent labs & imaging results that were available during my care of the patient were reviewed by me and considered in my medical decision making (see chart for details).    MDM Rules/Calculators/A&P 65 year old female who presents emergency department with viral upper respiratory symptoms and epistaxis.  She currently does not have a nosebleed on presentation to the emergency department or on my exam.  There does appear to be a left nare turbinate that is swollen, likely from viral upper respiratory illness.  COVID-positive. The patient is nontoxic-appearing and not in need of emergent medical intervention at this time.  She is told to self isolate at home and follow CDC guidelines for quarantine and when to return  back to work.  Given work note.  She is instructed to use Tylenol and ibuprofen as needed for fever and myalgias.  She may use over-the-counter cough and cold medication for symptomatic relief of symptoms.  She verbalizes understanding.  Safe for discharge Final Clinical Impression(s) / ED Diagnoses Final diagnoses:  COVID-19    Rx / DC Orders ED Discharge Orders     None        Cristopher Peru, PA-C 03/14/21 0002    Bethann Berkshire, MD 03/14/21 1115

## 2021-03-13 NOTE — Discharge Instructions (Addendum)
You are seen in the emergency department today for congestion, cough and nosebleed.  You are diagnosed with COVID-19.  Please quarantine at home.  You may use Tylenol and ibuprofen as needed for fever.  He can use over-the-counter cough and cold medications for relief of your symptoms.  Please return if you have shortness of breath.

## 2021-06-20 ENCOUNTER — Emergency Department (HOSPITAL_COMMUNITY)
Admission: EM | Admit: 2021-06-20 | Discharge: 2021-06-21 | Disposition: A | Discharge status: Home / Self Care | Payer: BC Managed Care – PPO | Attending: Emergency Medicine | Admitting: Emergency Medicine

## 2021-06-20 ENCOUNTER — Encounter (HOSPITAL_COMMUNITY): Payer: Self-pay

## 2021-06-20 ENCOUNTER — Other Ambulatory Visit: Payer: Self-pay

## 2021-06-20 ENCOUNTER — Emergency Department (HOSPITAL_COMMUNITY): Payer: BC Managed Care – PPO

## 2021-06-20 DIAGNOSIS — Z7951 Long term (current) use of inhaled steroids: Secondary | ICD-10-CM | POA: Diagnosis not present

## 2021-06-20 DIAGNOSIS — E876 Hypokalemia: Secondary | ICD-10-CM | POA: Insufficient documentation

## 2021-06-20 DIAGNOSIS — Z7984 Long term (current) use of oral hypoglycemic drugs: Secondary | ICD-10-CM | POA: Insufficient documentation

## 2021-06-20 DIAGNOSIS — Z8616 Personal history of COVID-19: Secondary | ICD-10-CM | POA: Insufficient documentation

## 2021-06-20 DIAGNOSIS — J45901 Unspecified asthma with (acute) exacerbation: Secondary | ICD-10-CM | POA: Insufficient documentation

## 2021-06-20 DIAGNOSIS — R0602 Shortness of breath: Secondary | ICD-10-CM | POA: Diagnosis present

## 2021-06-20 DIAGNOSIS — Z20822 Contact with and (suspected) exposure to covid-19: Secondary | ICD-10-CM | POA: Diagnosis not present

## 2021-06-20 DIAGNOSIS — E119 Type 2 diabetes mellitus without complications: Secondary | ICD-10-CM | POA: Insufficient documentation

## 2021-06-20 DIAGNOSIS — Z794 Long term (current) use of insulin: Secondary | ICD-10-CM | POA: Diagnosis not present

## 2021-06-20 LAB — CBC
HCT: 39.5 % (ref 36.0–46.0)
Hemoglobin: 12.6 g/dL (ref 12.0–15.0)
MCH: 29.4 pg (ref 26.0–34.0)
MCHC: 31.9 g/dL (ref 30.0–36.0)
MCV: 92.3 fL (ref 80.0–100.0)
Platelets: 361 10*3/uL (ref 150–400)
RBC: 4.28 MIL/uL (ref 3.87–5.11)
RDW: 15.5 % (ref 11.5–15.5)
WBC: 11.3 10*3/uL — ABNORMAL HIGH (ref 4.0–10.5)
nRBC: 0 % (ref 0.0–0.2)

## 2021-06-20 LAB — BASIC METABOLIC PANEL
Anion gap: 9 (ref 5–15)
BUN: 12 mg/dL (ref 8–23)
CO2: 22 mmol/L (ref 22–32)
Calcium: 9.4 mg/dL (ref 8.9–10.3)
Chloride: 107 mmol/L (ref 98–111)
Creatinine, Ser: 0.86 mg/dL (ref 0.44–1.00)
GFR, Estimated: >60 mL/min (ref >60)
Glucose, Bld: 124 mg/dL — ABNORMAL HIGH (ref 70–99)
Potassium: 3.4 mmol/L — ABNORMAL LOW (ref 3.5–5.1)
Sodium: 138 mmol/L (ref 135–145)

## 2021-06-20 LAB — RESP PANEL BY RT-PCR (FLU A&B, COVID) ARPGX2
Influenza A by PCR: NEGATIVE
Influenza B by PCR: NEGATIVE
SARS Coronavirus 2 by RT PCR: NEGATIVE

## 2021-06-20 MED ORDER — METHYLPREDNISOLONE SODIUM SUCC 125 MG IJ SOLR
125.0000 mg | Freq: Once | INTRAMUSCULAR | Status: AC
  Administered 2021-06-20: 125 mg via INTRAVENOUS
  Filled 2021-06-20: qty 2

## 2021-06-20 MED ORDER — ALBUTEROL (5 MG/ML) CONTINUOUS INHALATION SOLN: 2.5000 mg/h | INHALATION_SOLUTION | RESPIRATORY_TRACT | Status: DC

## 2021-06-20 MED ORDER — ALBUTEROL SULFATE (2.5 MG/3ML) 0.083% IN NEBU
INHALATION_SOLUTION | RESPIRATORY_TRACT | Status: AC
  Administered 2021-06-20: 10 mg
  Filled 2021-06-20: qty 12

## 2021-06-20 MED ORDER — HYDROCOD POLI-CHLORPHE POLI ER 10-8 MG/5ML PO SUER
5.0000 mL | Freq: Once | ORAL | Status: AC
  Administered 2021-06-20: 5 mL via ORAL
  Filled 2021-06-20: qty 5

## 2021-06-20 MED ORDER — IPRATROPIUM-ALBUTEROL 0.5-2.5 (3) MG/3ML IN SOLN
3.0000 mL | Freq: Once | RESPIRATORY_TRACT | Status: AC
  Administered 2021-06-20: 3 mL via RESPIRATORY_TRACT
  Filled 2021-06-20: qty 3

## 2021-06-20 MED ORDER — MAGNESIUM SULFATE 2 GM/50ML IV SOLN
2.0000 g | Freq: Once | INTRAVENOUS | Status: AC
  Administered 2021-06-20: 2 g via INTRAVENOUS
  Filled 2021-06-20: qty 50

## 2021-06-20 MED ORDER — ALBUTEROL SULFATE (2.5 MG/3ML) 0.083% IN NEBU
5.0000 mg | INHALATION_SOLUTION | Freq: Once | RESPIRATORY_TRACT | Status: AC
  Administered 2021-06-20: 5 mg via RESPIRATORY_TRACT
  Filled 2021-06-20: qty 6

## 2021-06-20 MED ORDER — ALBUTEROL SULFATE HFA 108 (90 BASE) MCG/ACT IN AERS: INHALATION_SPRAY | RESPIRATORY_TRACT | 1 refills | Status: DC

## 2021-06-20 NOTE — ED Provider Notes (Cosign Needed)
[facility] EMERGENCY DEPARTMENT Provider Note   CSN: [id number] Arrival date & time: 06/20/21  1756     History  Chief Complaint  Patient presents with   Shortness of Breath    Dominique Snyder is a 66 y.o. female with a past medical history of type 2 diabetes and asthma presenting today due to shortness of breath.  She reports that for around a month she has felt short of breath however it acutely worsened today.  Says she is unable to catch her breath.  Denies chest pain, palpitations, dizziness, history of DVT, recent travel or surgery.  Does not smoke cigarettes.  Reports that she has had baseline breathing problems since she had COVID-19 last year.  Has been using her albuterol inhaler nearly every 20 minutes for the past weeks.  No recent illness, fever, chills, cough or other concern.    Home Medications Prior to Admission medications   Medication Sig Start Date End Date Taking? Authorizing Provider  albuterol (VENTOLIN HFA) 108 (90 Base) MCG/ACT inhaler albuterol sulfate HFA 90 mcg/actuation aerosol inhaler    [provider]  atorvastatin (LIPITOR) 20 MG tablet atorvastatin 20 mg tablet    [provider]  insulin glargine (LANTUS SOLOSTAR) 100 UNIT/ML Solostar Pen Lantus Solostar U-100 Insulin 100 unit/mL (3 mL) subcutaneous pen  INJECT 10 UNITS ONCE A DAY SUBCUTANEOUS    [provider]  levothyroxine (SYNTHROID) 100 MCG tablet levothyroxine 100 mcg tablet    [provider]  metFORMIN (GLUCOPHAGE) 500 MG tablet metformin 500 mg tablet    [provider]  nabumetone (RELAFEN) 750 MG tablet nabumetone 750 mg tablet    [provider]  omeprazole (PRILOSEC) 20 MG capsule omeprazole 20 mg capsule,delayed release    [provider]  sitaGLIPtin (JANUVIA) 100 MG tablet Januvia 100 mg tablet  TAKE 1 TABLET BY MOUTH EVERY DAY    [provider]      Allergies    Strawberry (diagnostic)    Review of Systems    Review of Systems  Respiratory:  Positive for shortness of breath.   See HPI  Physical Exam Updated Vital Signs BP (!) 145/78 (BP Location: Right Arm)    Pulse (!) 108    Temp 98.2 F (36.8 C) (Oral)    Resp 17    Ht 5\' 6"  (1.676 m)    Wt 80.7 kg    SpO2 93%    BMI 28.73 kg/m  Physical Exam Vitals and nursing note reviewed.  Constitutional:      General: She is not in acute distress.    Appearance: Normal appearance.  HENT:     Head: Normocephalic and atraumatic.  Eyes:     General: No scleral icterus.    Conjunctiva/sclera: Conjunctivae normal.  Cardiovascular:     Rate and Rhythm: Regular rhythm. Tachycardia present.  Pulmonary:     Effort: Accessory muscle usage present. No respiratory distress.     Breath sounds: Wheezing and rhonchi present.  Skin:    Findings: No rash.  Neurological:     Mental Status: She is alert.  Psychiatric:        Mood and Affect: Mood normal.    ED Results / Procedures / Treatments   Labs (all labs ordered are listed, but only abnormal results are displayed) Labs Reviewed  BASIC METABOLIC PANEL - Abnormal; Notable for the following components:      Result Value   Potassium 3.4 (*)    Glucose, Bld 124 (*)  All other components within normal limits  CBC - Abnormal; Notable for the following components:   WBC 11.3 (*)    All other components within normal limits  RESP PANEL BY RT-PCR (FLU A&B, COVID) ARPGX2    EKG EKG Interpretation  Date/Time:  Monday June 20 2021 18:31:31 EST Ventricular Rate:  106 PR Interval:  161 QRS Duration: 77 QT Interval:  333 QTC Calculation: 443 R Axis:   67 Text Interpretation: Sinus tachycardia Probable anterior infarct, old Baseline wander in lead(s) V3 V4 Confirmed by Norman Clay ([id number]) on 06/20/2021 9:22:32 PM  Radiology No results found.  Procedures Procedures  Patient originally in sinus tach, however heart rate normalized to 91.   Medications Ordered in ED Medications   methylPREDNISolone sodium succinate (SOLU-MEDROL) 125 mg/2 mL injection 125 mg (has no administration in time range)  albuterol (PROVENTIL) (2.5 MG/3ML) 0.083% nebulizer solution 5 mg (has no administration in time range)  magnesium sulfate IVPB 2 g 50 mL (has no administration in time range)    ED Course/ Medical Decision Making/ A&P                           Medical Decision Making Amount and/or Complexity of Data Reviewed Labs: ordered. Radiology: ordered.  Risk Prescription drug management.   Patient presents to the ED for concern of sob.  History of asthma.  The emergent differential diagnosis for shortness of breath includes, but is not limited to, asthma exacerbation, Pulmonary edema, bronchoconstriction, Pneumonia, Pulmonary embolism, Pneumotherax/ Hemothorax, Dysrythmia, and ACS.    Co morbidities that complicate the patient evaluation include: Asthma, ran out of her albuterol inhaler  Additional history obtained from internal and external records: **   Workup:  Lab Tests:  I Ordered and viewed labs.  No pertinent results.  Mild hypokalemia.   2. Imaging Studies ordered:  I ordered and individually viewed patient's chest x-ray.  No signs of pneumonia, effusion, edema.  Radiologist read negative.  3. Cardiac Monitoring:  The patient was maintained on a cardiac monitor.  I personally viewed and interpreted the cardiac monitored which showed normal sinus rhythm.   4. Test Considered: CTPA.  Patient is without risk factors for PE.  Low likelihood [place].  Unable to apply PERC.  More likely explanation for patient's shortness of breath and borderline hypoxia include her poorly controlled asthma.  5. Critical Interventions: Initiation of oxygen and albuterol inhalers.   Treatment:  Medications:  I ordered DuoNeb x2, magnesium, Tussionex and Solu-Medrol for patient's presumed asthma attack.  She was reevaluated after each DuoNeb and began to feel somewhat better  however continued to wheeze with scattered rhonchi as well.   I spoke with Dr. Audley Hose and patient was subsequently placed on continuous albuterol inhaler.  On reassessment, patient was without shortness of breath, wheezing or rhonchi.   Dispo:  Problem List / ED Course:  Asthma exacerbation   Dispostion:  After the interventions noted above, I reevaluated the patient and found that they are resting comfortably, asleep on the stretcher.  No wheezing or rhonchi.  Work of breathing normal.  At this time I believe patient is stable for discharge home with follow-up with her primary care provider as soon as possible.  She will also have her albuterol inhaler refilled.  Given Solu-Medrol, will not be discharged on prednisone.  Return precautions discussed.  Final Clinical Impression(s) / ED Diagnoses Final diagnoses:  Exacerbation of asthma, unspecified asthma severity, unspecified whether persistent  Rx / DC Orders Results and diagnoses were explained to the patient. Return precautions discussed in full. Patient had no additional questions and expressed complete understanding.   This chart was dictated using voice recognition software.  Despite best efforts to proofread,  errors can occur which can change the documentation meaning.    Saddie Benders, PA-C 06/24/21 1221

## 2021-06-20 NOTE — ED Triage Notes (Signed)
Pt presents to ED with complaints of shortness of breath, doesn't feel like she can catch her breath. Pt with productive cough. States symptoms have been going on since she had covid last year but got worse today.

## 2021-06-20 NOTE — Discharge Instructions (Addendum)
Please follow-up with your primary care provider within the next 3 days to discuss your blood control.  Return with any worsening shortness of breath or chest pain.  Read the information about asthma attacks attached to these discharge papers.  Your albuterol inhaler has been refilled.

## 2021-06-26 ENCOUNTER — Emergency Department (HOSPITAL_COMMUNITY)
Admission: EM | Admit: 2021-06-26 | Discharge: 2021-06-27 | Disposition: A | Discharge status: Home / Self Care | Payer: BC Managed Care – PPO | Attending: Emergency Medicine | Admitting: Emergency Medicine

## 2021-06-26 ENCOUNTER — Other Ambulatory Visit: Payer: Self-pay

## 2021-06-26 ENCOUNTER — Encounter (HOSPITAL_COMMUNITY): Payer: Self-pay | Admitting: *Deleted

## 2021-06-26 DIAGNOSIS — E039 Hypothyroidism, unspecified: Secondary | ICD-10-CM | POA: Insufficient documentation

## 2021-06-26 DIAGNOSIS — R062 Wheezing: Secondary | ICD-10-CM | POA: Diagnosis present

## 2021-06-26 DIAGNOSIS — J45909 Unspecified asthma, uncomplicated: Secondary | ICD-10-CM | POA: Diagnosis not present

## 2021-06-26 DIAGNOSIS — Z7984 Long term (current) use of oral hypoglycemic drugs: Secondary | ICD-10-CM | POA: Diagnosis not present

## 2021-06-26 DIAGNOSIS — R059 Cough, unspecified: Secondary | ICD-10-CM | POA: Diagnosis not present

## 2021-06-26 DIAGNOSIS — J4541 Moderate persistent asthma with (acute) exacerbation: Secondary | ICD-10-CM

## 2021-06-26 DIAGNOSIS — E119 Type 2 diabetes mellitus without complications: Secondary | ICD-10-CM | POA: Insufficient documentation

## 2021-06-26 DIAGNOSIS — Z794 Long term (current) use of insulin: Secondary | ICD-10-CM | POA: Diagnosis not present

## 2021-06-26 DIAGNOSIS — J449 Chronic obstructive pulmonary disease, unspecified: Secondary | ICD-10-CM | POA: Diagnosis not present

## 2021-06-26 HISTORY — DX: Unspecified asthma, uncomplicated: J45.909

## 2021-06-26 LAB — CBC WITH DIFFERENTIAL/PLATELET
Abs Immature Granulocytes: 0.04 10*3/uL (ref 0.00–0.07)
Basophils Absolute: 0.1 10*3/uL (ref 0.0–0.1)
Basophils Relative: 1 %
Eosinophils Absolute: 1.3 10*3/uL — ABNORMAL HIGH (ref 0.0–0.5)
Eosinophils Relative: 11 %
HCT: 46.2 % — ABNORMAL HIGH (ref 36.0–46.0)
Hemoglobin: 14.3 g/dL (ref 12.0–15.0)
Immature Granulocytes: 0 %
Lymphocytes Relative: 13 %
Lymphs Abs: 1.6 10*3/uL (ref 0.7–4.0)
MCH: 28.5 pg (ref 26.0–34.0)
MCHC: 31 g/dL (ref 30.0–36.0)
MCV: 92.2 fL (ref 80.0–100.0)
Monocytes Absolute: 0.4 10*3/uL (ref 0.1–1.0)
Monocytes Relative: 4 %
Neutro Abs: 9.1 10*3/uL — ABNORMAL HIGH (ref 1.7–7.7)
Neutrophils Relative %: 71 %
Platelets: 431 10*3/uL — ABNORMAL HIGH (ref 150–400)
RBC: 5.01 MIL/uL (ref 3.87–5.11)
RDW: 15.2 % (ref 11.5–15.5)
WBC: 12.6 10*3/uL — ABNORMAL HIGH (ref 4.0–10.5)
nRBC: 0 % (ref 0.0–0.2)

## 2021-06-26 MED ORDER — METHYLPREDNISOLONE SODIUM SUCC 125 MG IJ SOLR
125.0000 mg | Freq: Once | INTRAMUSCULAR | Status: AC
  Administered 2021-06-26: 125 mg via INTRAVENOUS
  Filled 2021-06-26: qty 2

## 2021-06-26 MED ORDER — IPRATROPIUM BROMIDE 0.02 % IN SOLN
RESPIRATORY_TRACT | Status: AC
  Administered 2021-06-26: 1 mg
  Filled 2021-06-26: qty 5

## 2021-06-26 MED ORDER — LEVALBUTEROL HCL 1.25 MG/0.5ML IN NEBU
INHALATION_SOLUTION | RESPIRATORY_TRACT | Status: AC
  Administered 2021-06-26: 7.5 mg
  Filled 2021-06-26: qty 3

## 2021-06-26 MED ORDER — MAGNESIUM SULFATE 2 GM/50ML IV SOLN
2.0000 g | Freq: Once | INTRAVENOUS | Status: AC
  Administered 2021-06-26: 2 g via INTRAVENOUS
  Filled 2021-06-26: qty 50

## 2021-06-26 NOTE — ED Provider Notes (Signed)
?[place] ?Provider Note ? ? ?CSN: [id number] ?Arrival date & time: 06/26/21  2254 ? ?  ? ?History ? ?Chief Complaint  ?Patient presents with  ? Wheezing  ? ? ?Dominique Snyder is a 66 y.o. female. ? ?Patient is a 66 year old female with past medical history of diabetes, hypothyroidism, GERD, and recently diagnosed asthma/COPD.  Patient presenting with a 3-hour history of wheezing and difficulty breathing.  She denies any fevers or chills, but does report intermittent productive cough.  She denies any chest pain or leg swelling.  Patient was here with similar complaints 1 week ago.  She was given breathing treatments and steroids as well as a home nebulizer.  She has been using the nebulizer at home this evening with little relief. ? ?The history is provided by the patient.  ?Wheezing ?Severity:  Moderate ?Onset quality:  Sudden ?Duration:  3 hours ?Timing:  Constant ?Progression:  Worsening ?Chronicity:  Recurrent ?Relieved by:  Nothing ?Worsened by:  Nothing ?Ineffective treatments:  Home nebulizer ?Associated symptoms: cough and shortness of breath   ?Associated symptoms: no chest pain   ? ?  ? ?Home Medications ?Prior to Admission medications   ?Medication Sig Start Date End Date Taking? Authorizing Provider  ?albuterol (VENTOLIN HFA) 108 (90 Base) MCG/ACT inhaler albuterol sulfate HFA 90 mcg/actuation aerosol inhaler 06/20/21   Redwine, Madison A, PA-C  ?atorvastatin (LIPITOR) 20 MG tablet atorvastatin 20 mg tablet    [provider]  ?insulin glargine (LANTUS SOLOSTAR) 100 UNIT/ML Solostar Pen Lantus Solostar U-100 Insulin 100 unit/mL (3 mL) subcutaneous pen ? INJECT 10 UNITS ONCE A DAY SUBCUTANEOUS    [provider]  ?levothyroxine (SYNTHROID) 100 MCG tablet levothyroxine 100 mcg tablet    [provider]  ?metFORMIN (GLUCOPHAGE) 500 MG tablet metformin 500 mg tablet    [provider]  ?nabumetone (RELAFEN) 750 MG tablet nabumetone 750 mg tablet     [provider]  ?omeprazole (PRILOSEC) 20 MG capsule omeprazole 20 mg capsule,delayed release    [provider]  ?sitaGLIPtin (JANUVIA) 100 MG tablet Januvia 100 mg tablet ? TAKE 1 TABLET BY MOUTH EVERY DAY    [provider]  ?   ? ?Allergies    ?Strawberry (diagnostic)   ? ?Review of Systems   ?Review of Systems  ?Respiratory:  Positive for cough, shortness of breath and wheezing.   ?Cardiovascular:  Negative for chest pain.  ?All other systems reviewed and are negative. ? ?Physical Exam ?Updated Vital Signs ?BP 123/78   Pulse 98   Temp 97.6 ?F (36.4 ?C) (Oral)   Resp 20   Ht 5\' 6"  (1.676 m)   Wt 80.7 kg   SpO2 95%   BMI 28.73 kg/m?  ?Physical Exam ?Vitals and nursing note reviewed.  ?Constitutional:   ?   General: She is not in acute distress. ?   Appearance: She is well-developed. She is not diaphoretic.  ?HENT:  ?   Head: Normocephalic and atraumatic.  ?Cardiovascular:  ?   Rate and Rhythm: Normal rate and regular rhythm.  ?   Heart sounds: No murmur heard. ?  No friction rub. No gallop.  ?Pulmonary:  ?   Effort: Respiratory distress present.  ?   Breath sounds: Wheezing and rhonchi present.  ?   Comments: Patient with rhonchorous breath sounds bilaterally with inspiration.  She is in mild respiratory distress with accessory muscle use and mild tachypnea. ?Abdominal:  ?   General: Bowel sounds are normal. There  is no distension.  ?   Palpations: Abdomen is soft.  ?   Tenderness: There is no abdominal tenderness.  ?Musculoskeletal:     ?   General: Normal range of motion.  ?   Cervical back: Normal range of motion and neck supple.  ?Skin: ?   General: Skin is warm and dry.  ?Neurological:  ?   General: No focal deficit present.  ?   Mental Status: She is alert and oriented to person, place, and time.  ? ? ?ED Results / Procedures / Treatments   ?Labs ?(all labs ordered are listed, but only abnormal results are displayed) ?Labs Reviewed - No data to display ? ?EKG ?EKG  Interpretation ? ?Date/Time:  Sunday June 26 2021 23:08:34 EST ?Ventricular Rate:  99 ?PR Interval:  145 ?QRS Duration: 83 ?QT Interval:  352 ?QTC Calculation: 452 ?R Axis:   63 ?Text Interpretation: Sinus rhythm Borderline T wave abnormalities No significant change since 06/20/2021 Confirmed by Veryl Speak [id number]) on 06/26/2021 11:15:47 PM ? ?Radiology ?No results found. ? ?Procedures ?Procedures  ? ? ?Medications Ordered in ED ?Medications  ?methylPREDNISolone sodium succinate (SOLU-MEDROL) 125 mg/2 mL injection 125 mg (has no administration in time range)  ?magnesium sulfate IVPB 2 g 50 mL (has no administration in time range)  ?ipratropium (ATROVENT) 0.02 % nebulizer solution (1 mg  Given 06/26/21 2318)  ?levalbuterol (XOPENEX) 1.25 MG/0.5ML nebulizer solution (7.5 mg  Given 06/26/21 2317)  ? ? ?ED Course/ Medical Decision Making/ A&P ? ?This patient presents to the ED for concern of shortness of breath and wheezing, this involves an extensive number of treatment options, and is a complaint that carries with it a high risk of complications and morbidity.  The differential diagnosis includes COPD/asthma exacerbation, pneumonia ? ? ?Co morbidities that complicate the patient evaluation ? ?None ? ? ?Additional history obtained: ? ?No additional history or external records needed ? ? ?Lab Tests: ? ?I Ordered, and personally interpreted labs.  The pertinent results include: CBC, metabolic panel, BNP, and troponin.  All of these are essentially unremarkable ? ? ?Imaging Studies ordered: ? ?I ordered imaging studies including chest x-ray ?I independently visualized and interpreted imaging which showed no acute process ?I agree with the radiologist interpretation ? ? ?Cardiac Monitoring: ? ?The patient was maintained on a cardiac monitor.  I personally viewed and interpreted the cardiac monitored which showed an underlying rhythm of: Sinus rhythm ? ? ?Medicines ordered and prescription drug management: ? ?I ordered  medication including Xopenex and DuoNeb along with Solu-Medrol and magnesium for asthma exacerbation/bronchospasm ?Reevaluation of the patient after these medicines showed that the patient resolved ?I have reviewed the patients home medicines and have made adjustments as needed ? ? ?Test Considered: ? ?No other test considered or ordered ? ? ?Critical Interventions: ? ?IV medications and repeat nebulizer treatments ? ? ?Consultations Obtained: ? ?No emergent consultations obtained, but patient will be referred to pulmonology due to this recent onset of COPD/asthma-like symptoms ? ? ?Problem List / ED Course: ? ?Patient presenting with shortness of breath and wheezing.  Patient arrived here in mild respiratory distress.  She received steroids, magnesium, and repeat nebulizer treatments with good relief of her symptoms. ?Her work-up is unremarkable and chest x-ray is clear.  At this point, I feel as though patient can safely be discharged.  She will be prescribed prednisone and outpatient referral to pulmonology will be made. ? ? ? ?Social Determinants of Health: ? ?None ? ? ? ? ?  Final Clinical Impression(s) / ED Diagnoses ?Final diagnoses:  ?None  ? ? ?Rx / DC Orders ?ED Discharge Orders   ? ? None  ? ?  ? ? ?  ?Veryl Speak, MD ?06/27/21 0144 ? ?

## 2021-06-26 NOTE — ED Triage Notes (Signed)
Pt wheezing and SOB for past 3 hours, took two nebs tx at home without relief. Audible wheezing noted in triage. Denies any pain.  ?

## 2021-06-27 ENCOUNTER — Emergency Department (HOSPITAL_COMMUNITY): Payer: BC Managed Care – PPO

## 2021-06-27 LAB — TROPONIN I (HIGH SENSITIVITY)
Troponin I (High Sensitivity): 4 ng/L (ref <18)
Troponin I (High Sensitivity): 3 ng/L (ref <18)

## 2021-06-27 LAB — BRAIN NATRIURETIC PEPTIDE: B Natriuretic Peptide: 7 pg/mL (ref 0.0–100.0)

## 2021-06-27 LAB — BASIC METABOLIC PANEL
Anion gap: 9 (ref 5–15)
BUN: 11 mg/dL (ref 8–23)
CO2: 27 mmol/L (ref 22–32)
Calcium: 9.7 mg/dL (ref 8.9–10.3)
Chloride: 102 mmol/L (ref 98–111)
Creatinine, Ser: 0.92 mg/dL (ref 0.44–1.00)
GFR, Estimated: >60 mL/min (ref >60)
Glucose, Bld: 133 mg/dL — ABNORMAL HIGH (ref 70–99)
Potassium: 4.3 mmol/L (ref 3.5–5.1)
Sodium: 138 mmol/L (ref 135–145)

## 2021-06-27 MED ORDER — PREDNISONE 10 MG PO TABS: 20.0000 mg | ORAL_TABLET | Freq: Two times a day (BID) | ORAL | 0 refills | Status: DC

## 2021-06-27 MED ORDER — IPRATROPIUM-ALBUTEROL 0.5-2.5 (3) MG/3ML IN SOLN
3.0000 mL | Freq: Once | RESPIRATORY_TRACT | Status: AC
  Administered 2021-06-27: 3 mL via RESPIRATORY_TRACT
  Filled 2021-06-27: qty 3

## 2021-06-27 NOTE — Discharge Instructions (Addendum)
Begin taking prednisone as prescribed. ? ?Continue breathing treatments at home every 4 hours as needed. ? ?Follow-up with pulmonology in the next week.  The contact information for [facility] pulmonology has been provided in this discharge summary for you to call and make these arrangements. ? ?Return to the emergency department in the meantime if symptoms significantly worsen or change. ?

## 2021-07-04 ENCOUNTER — Other Ambulatory Visit: Payer: Self-pay

## 2021-07-04 ENCOUNTER — Ambulatory Visit (INDEPENDENT_AMBULATORY_CARE_PROVIDER_SITE_OTHER): Payer: BC Managed Care – PPO | Admitting: Pulmonary Disease

## 2021-07-04 ENCOUNTER — Encounter: Payer: Self-pay | Admitting: Pulmonary Disease

## 2021-07-04 VITALS — BP 120/90 | HR 97 | Temp 98.2°F | Ht 66.0 in | Wt 174.2 lb

## 2021-07-04 DIAGNOSIS — R0609 Other forms of dyspnea: Secondary | ICD-10-CM

## 2021-07-04 DIAGNOSIS — J4551 Severe persistent asthma with (acute) exacerbation: Secondary | ICD-10-CM

## 2021-07-04 DIAGNOSIS — U099 Post covid-19 condition, unspecified: Secondary | ICD-10-CM

## 2021-07-04 LAB — CBC WITH DIFFERENTIAL/PLATELET
Basophils Absolute: 0.1 10*3/uL (ref 0.0–0.1)
Basophils Relative: 0.7 % (ref 0.0–3.0)
Eosinophils Absolute: 0.9 10*3/uL — ABNORMAL HIGH (ref 0.0–0.7)
Eosinophils Relative: 8 % — ABNORMAL HIGH (ref 0.0–5.0)
HCT: 41.6 % (ref 36.0–46.0)
Hemoglobin: 13.7 g/dL (ref 12.0–15.0)
Lymphocytes Relative: 18.1 % (ref 12.0–46.0)
Lymphs Abs: 2.1 10*3/uL (ref 0.7–4.0)
MCHC: 33 g/dL (ref 30.0–36.0)
MCV: 88.6 fl (ref 78.0–100.0)
Monocytes Absolute: 0.6 10*3/uL (ref 0.1–1.0)
Monocytes Relative: 5.1 % (ref 3.0–12.0)
Neutro Abs: 8.1 10*3/uL — ABNORMAL HIGH (ref 1.4–7.7)
Neutrophils Relative %: 68.1 % (ref 43.0–77.0)
Platelets: 403 10*3/uL — ABNORMAL HIGH (ref 150.0–400.0)
RBC: 4.69 Mil/uL (ref 3.87–5.11)
RDW: 15.4 % (ref 11.5–15.5)
WBC: 11.8 10*3/uL — ABNORMAL HIGH (ref 4.0–10.5)

## 2021-07-04 MED ORDER — FLUTICASONE-SALMETEROL 250-50 MCG/ACT IN AEPB: 1.0000 | INHALATION_SPRAY | Freq: Two times a day (BID) | RESPIRATORY_TRACT | 5 refills | Status: DC

## 2021-07-04 MED ORDER — MONTELUKAST SODIUM 10 MG PO TABS: 10.0000 mg | ORAL_TABLET | Freq: Every day | ORAL | 11 refills | Status: DC

## 2021-07-04 MED ORDER — PREDNISONE 10 MG PO TABS: ORAL_TABLET | ORAL | 0 refills | Status: DC

## 2021-07-04 MED ORDER — ALBUTEROL SULFATE HFA 108 (90 BASE) MCG/ACT IN AERS
INHALATION_SPRAY | RESPIRATORY_TRACT | 3 refills | Status: DC
Start: 2021-07-04 — End: 2023-12-19

## 2021-07-04 NOTE — Progress Notes (Signed)
? ? ?Subjective:  ? ?PATIENT ID: Dominique Snyder GENDER: female DOB: 1955-07-08, MRN: [id number] ? ? ?HPI ? ?Chief Complaint  ?Patient presents with  ? Consult  ? ? ?Reason for Visit: New consult for asthma ? ?Ms. Dominique Snyder is a 66 year old female with asthma, diabetes, hypothyroidism and reflux who presents as a new consult for asthma. ? ?She was diagnosed with COVID in November 2022 in the ED.  Did not require admission.  Since then she has returned to the ED at the end of February and the beginning of March for gradually worsening shortness of breath and wheezing requiring frequent albuterol use.  She was treated with nebulizers and steroids and magnesium.  Referred to outpatient pulmonary on 06/26/2021. ? ?She has tried Engineer, materials twice. Using albuterol three times a day and nebulizer at home. She is having shortness of of breath and wheezing during the day and night. Never treated with steroids until she was seen ED. ? ?Asthma Control Test ACT Total Score  ?07/04/2021 10  ? ?Social History: ?Former smoker. 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 ?I have personally reviewed patient's past medical/family/social history, allergies, current medications. ? ?Past Medical History:  ?Diagnosis Date  ? Asthma   ? Diabetes mellitus without complication (HCC)   ? High cholesterol   ? Thyroid disease   ?  ? ?Family History  ?Problem Relation Age of Onset  ? Leukemia Mother   ? Asthma Neg Hx   ?  ? ?Social History  ? ?Occupational History  ? Not on file  ?Tobacco Use  ? Smoking status: Former  ?  Years: 12.00  ?  Types: Cigarettes  ?  Quit date: 05/25/1982  ?  Years since quitting: 39.1  ? Smokeless tobacco: Never  ? Tobacco comments:  ?  Social smoker.  Smoked 2-3 per day.  It mostly just burned in the ash tray.  07/04/2021  hfb  ?Substance and Sexual Activity  ? Alcohol use: Never  ? Drug use: Never  ? Sexual activity: Not on file  ? ? ?Allergies  ?Allergen Reactions  ? Biotin   ? Iodinated Contrast Media Hives  and Other (See Comments)  ?  Shrimp  ? Other Other (See Comments)  ?  Sinus medications ?Vanance Forte ?Berries  ? Strawberry (Diagnostic) Hives  ?  "all berries"  ?  ? ?Outpatient Medications Prior to Visit  ?Medication Sig Dispense Refill  ? atorvastatin (LIPITOR) 20 MG tablet atorvastatin 20 mg tablet    ? insulin glargine (LANTUS SOLOSTAR) 100 UNIT/ML Solostar Pen 40 Units daily.    ? levothyroxine (SYNTHROID) 100 MCG tablet levothyroxine 100 mcg tablet    ? metFORMIN (GLUCOPHAGE) 500 MG tablet metformin 500 mg tablet    ? nabumetone (RELAFEN) 750 MG tablet Take 750 mg by mouth as needed.    ? omeprazole (PRILOSEC) 20 MG capsule omeprazole 20 mg capsule,delayed release    ? sitaGLIPtin (JANUVIA) 100 MG tablet Januvia 100 mg tablet ? TAKE 1 TABLET BY MOUTH EVERY DAY    ? albuterol (VENTOLIN HFA) 108 (90 Base) MCG/ACT inhaler albuterol sulfate HFA 90 mcg/actuation aerosol inhaler 8 g 1  ? predniSONE (DELTASONE) 10 MG tablet Take 2 tablets (20 mg total) by mouth 2 (two) times daily. 20 tablet 0  ? ?No facility-administered medications prior to visit.  ? ? ?Review of Systems  ?Constitutional:  Negative for chills, diaphoresis, fever, malaise/fatigue and weight loss.  ?HENT:  Negative for congestion.   ?Respiratory:  Positive for cough, shortness of breath and wheezing. Negative for hemoptysis and sputum production.   ?Cardiovascular:  Negative for chest pain, palpitations and leg swelling.  ? ? ?Objective:  ? ?Vitals:  ? 07/04/21 1019  ?BP: 120/90  ?Pulse: 97  ?Temp: 98.2 ?F (36.8 ?C)  ?TempSrc: Oral  ?SpO2: 96%  ?Weight: 174 lb 3.2 oz (79 kg)  ?Height: 5\' 6"  (1.676 m)  ? ?SpO2: 96 % (85% after walking from lobby, after sitting up to 96%) ?O2 Device: None (Room air) ? ?Physical Exam: ?General: Well-appearing, no acute distress ?HENT: [place], AT ?Eyes: EOMI, no scleral icterus ?Respiratory: Diminished air entry. Bilateral wheezing and mild rhonchi. ?Cardiovascular: RRR, -M/R/G, no  JVD ?Extremities:-Edema,-tenderness ?Neuro: AAO x4, CNII-XII grossly intact ?Psych: Normal mood, normal affect ? ?Data Reviewed: ? ?Imaging: ?CTA 10/06/2019-no pulmonary embolism.  No infiltrate, effusion or edema.  No parenchymal abnormalities ?CXR 06/20/2021-normal chest x-ray with no parenchymal abnormalities.  No infiltrate, effusion or edema. ?CXR 06/27/2021-normal chest x-ray with no parenchymal abnormalities.  No infiltrate effusion or edema. ? ?PFT: ?None on file ? ?Labs: ?CBC ?   ?Component Value Date/Time  ? WBC 12.6 (H) 06/26/2021 2312  ? RBC 5.01 06/26/2021 2312  ? HGB 14.3 06/26/2021 2312  ? HCT 46.2 (H) 06/26/2021 2312  ? PLT 431 (H) 06/26/2021 2312  ? MCV 92.2 06/26/2021 2312  ? MCH 28.5 06/26/2021 2312  ? MCHC 31.0 06/26/2021 2312  ? RDW 15.2 06/26/2021 2312  ? LYMPHSABS 1.6 06/26/2021 2312  ? MONOABS 0.4 06/26/2021 2312  ? EOSABS 1.3 (H) 06/26/2021 2312  ? BASOSABS 0.1 06/26/2021 2312  ? ?Absolute eos 06/26/21 - 1300 ? ?   ?Assessment & Plan:  ? ?Discussion: ?66 year old female with asthma diabetes, hypothyroidism and reflux who presents as a new consult for asthma. Discussed clinical course and management of asthma including bronchodilator regimen and action plan for exacerbation. We reviewed clinical course of COVID-19 including long-term complications including post-inflammatory lung disease and long hauler symptoms. ? ?Severe asthma Exacerbation ?--START prednisone taper ?--START Advair 250-50 mcg ONE puff TWICE a day ?--CONTINUE Albuterol AS NEEDED for shortness of breath or wheezing ?--START Singulair 10 mg ONCE a day ?--ORDER CBC with diff ? ?Health Maintenance ?Immunization History  ?Administered Date(s) Administered  ? Moderna Sars-Covid-2 Vaccination 08/02/2019, 08/30/2019  ? Tdap 07/09/2018  ? ?CT Lung Screen - not qualified  ? ?Orders Placed This Encounter  ?Procedures  ? CBC w/Diff  ?  Standing Status:   Future  ?  Number of Occurrences:   1  ?  Standing Expiration Date:   07/04/2022  ? IgE  ?   Standing Status:   Future  ?  Number of Occurrences:   1  ?  Standing Expiration Date:   07/05/2022  ? ?Meds ordered this encounter  ?Medications  ? fluticasone-salmeterol (ADVAIR) 250-50 MCG/ACT AEPB  ?  Sig: Inhale 1 puff into the lungs in the morning and at bedtime.  ?  Dispense:  60 each  ?  Refill:  5  ? albuterol (VENTOLIN HFA) 108 (90 Base) MCG/ACT inhaler  ?  Sig: albuterol sulfate HFA 90 mcg/actuation aerosol inhaler  ?  Dispense:  8 g  ?  Refill:  3  ? predniSONE (DELTASONE) 10 MG tablet  ?  Sig: Take 6 tablets x three days (60 mg), followed by 5 tablets x three days (50mg ), then 4 tablets x three day(40mg ), then 3 tablets (30mg ) x three days, then 2 tablets (20mg ) x three days, then 1  tablet (10mg ) x three days, then STOP  ?  Dispense:  60 tablet  ?  Refill:  0  ? montelukast (SINGULAIR) 10 MG tablet  ?  Sig: Take 1 tablet (10 mg total) by mouth at bedtime.  ?  Dispense:  30 tablet  ?  Refill:  11  ? ? ?Return in about 2 weeks (around 07/18/2021). ? ?I have spent a total time of 45-minutes on the day of the appointment reviewing prior documentation, coordinating care and discussing medical diagnosis and plan with the patient/family. Imaging, labs and tests included in this note have been reviewed and interpreted independently by me. ? ?Dominique Shiflet Mechele CollinJane Kieana Livesay, MD ?[facility] ?07/04/2021 11:15 AM  ?Office Number [phone] ? ? ?

## 2021-07-04 NOTE — Patient Instructions (Addendum)
Severe asthma exacerbation ?COVID-19 long hauler ?--START prednisone taper ?--START Advair 250-50 mcg ONE puff TWICE a day ?--CONTINUE Albuterol AS NEEDED for shortness of breath or wheezing ?--START Singulair 10 mg ONCE a day ?--ORDER labs: CBC with diff and IgE ? ?Follow-up with me in 2 weeks ?

## 2021-07-05 LAB — IGE: IgE (Immunoglobulin E), Serum: 628 kU/L — ABNORMAL HIGH (ref <=114)

## 2021-07-07 DIAGNOSIS — J4540 Moderate persistent asthma, uncomplicated: Secondary | ICD-10-CM | POA: Insufficient documentation

## 2021-07-20 ENCOUNTER — Ambulatory Visit: Payer: BC Managed Care – PPO | Admitting: Pulmonary Disease

## 2021-07-20 ENCOUNTER — Other Ambulatory Visit: Payer: Self-pay

## 2021-07-20 ENCOUNTER — Encounter: Payer: Self-pay | Admitting: Pulmonary Disease

## 2021-07-20 VITALS — BP 140/80 | HR 91 | Ht 66.0 in | Wt 175.4 lb

## 2021-07-20 DIAGNOSIS — J4550 Severe persistent asthma, uncomplicated: Secondary | ICD-10-CM | POA: Insufficient documentation

## 2021-07-20 MED ORDER — PREDNISONE 10 MG PO TABS: ORAL_TABLET | ORAL | 0 refills | Status: AC

## 2021-07-20 NOTE — Progress Notes (Signed)
? ? ?Subjective:  ? ?PATIENT ID: Dominique Snyder GENDER: female DOB: November 03, 1955, MRN: [id number] ? ? ?HPI ? ?Chief Complaint  ?Patient presents with  ? Follow-up  ?  Asthma is doing a lot better  ? ? ?Reason for Visit: Follow-up ? ?Ms. Dominique Snyder is a 66 year old female with asthma, diabetes, hypothyroidism and reflux who presents as a new consult for asthma. ? ?Synopsis: ?She was diagnosed with COVID in November 2022 in the ED.  Did not require admission.  Since then she has returned to the ED at the end of February and the beginning of March for gradually worsening shortness of breath and wheezing requiring frequent albuterol use.  She was treated with nebulizers and steroids and magnesium.  Referred to outpatient pulmonary on 06/26/2021. She has tried Engineer, materials twice. Using albuterol three times a day and nebulizer at home. She is having shortness of of breath and wheezing during the day and night. Never treated with steroids until she was seen ED. ? ?07/20/21 ?Since our last visit she was started on Advair and treated for asthma exacerbation with prednisone taper. She uses albuterol twice a day. Denies frequent shortness of breath, cough or wheezing except when she is at work where she has dust exposure. She changes her mask often to reduce exposure. ? ?Asthma Control Test ACT Total Score  ?07/20/2021 ? 9:23 AM 14  ?07/04/2021 ?10:17 AM 10  ? ?2023 Jan Feb Mar April May June July Aug Sept Oct Nov Dec  ?  X X           ?2024 Jan Feb Mar April May June July Aug Sept Oct Nov Dec  ?              ?2025 Jan Feb Mar April May June July Aug Sept Oct Nov Dec  ?              ? ?Social History: ?Former smoker. 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 ?Past Medical History:  ?Diagnosis Date  ? Asthma   ? Diabetes mellitus without complication (HCC)   ? High cholesterol   ? Thyroid disease   ?  ? ?Family History  ?Problem Relation Age of Onset  ? Leukemia Mother   ? Asthma Neg Hx   ?  ? ?Social History   ? ?Occupational History  ? Not on file  ?Tobacco Use  ? Smoking status: Former  ?  Years: 12.00  ?  Types: Cigarettes  ?  Quit date: 05/25/1982  ?  Years since quitting: 39.1  ? Smokeless tobacco: Never  ? Tobacco comments:  ?  Social smoker.  Smoked 2-3 per day.  It mostly just burned in the ash tray.  07/04/2021  hfb  ?Substance and Sexual Activity  ? Alcohol use: Never  ? Drug use: Never  ? Sexual activity: Not on file  ? ? ?Allergies  ?Allergen Reactions  ? Biotin   ? Iodinated Contrast Media Hives and Other (See Comments)  ?  Shrimp  ? Other Other (See Comments)  ?  Sinus medications ?Vanance Forte ?Berries  ? Strawberry (Diagnostic) Hives  ?  "all berries"  ?  ? ?Outpatient Medications Prior to Visit  ?Medication Sig Dispense Refill  ? albuterol (VENTOLIN HFA) 108 (90 Base) MCG/ACT inhaler albuterol sulfate HFA 90 mcg/actuation aerosol inhaler 8 g 3  ? atorvastatin (LIPITOR) 20 MG tablet atorvastatin 20 mg tablet    ? fluticasone-salmeterol (ADVAIR) 250-50 MCG/ACT AEPB Inhale 1 puff into  the lungs in the morning and at bedtime. 60 each 5  ? insulin glargine (LANTUS SOLOSTAR) 100 UNIT/ML Solostar Pen 40 Units daily.    ? levothyroxine (SYNTHROID) 100 MCG tablet levothyroxine 100 mcg tablet    ? metFORMIN (GLUCOPHAGE) 500 MG tablet metformin 500 mg tablet    ? montelukast (SINGULAIR) 10 MG tablet Take 1 tablet (10 mg total) by mouth at bedtime. 30 tablet 11  ? nabumetone (RELAFEN) 750 MG tablet Take 750 mg by mouth as needed.    ? omeprazole (PRILOSEC) 20 MG capsule omeprazole 20 mg capsule,delayed release    ? sitaGLIPtin (JANUVIA) 100 MG tablet Januvia 100 mg tablet ? TAKE 1 TABLET BY MOUTH EVERY DAY    ? predniSONE (DELTASONE) 10 MG tablet Take 6 tablets x three days (60 mg), followed by 5 tablets x three days (50mg ), then 4 tablets x three day(40mg ), then 3 tablets (30mg ) x three days, then 2 tablets (20mg ) x three days, then 1 tablet (10mg ) x three days, then STOP 60 tablet 0  ? ?No facility-administered  medications prior to visit.  ? ? ?Review of Systems  ?Constitutional:  Negative for chills, diaphoresis, fever, malaise/fatigue and weight loss.  ?HENT:  Negative for congestion.   ?Respiratory:  Negative for cough, hemoptysis, sputum production, shortness of breath and wheezing.   ?Cardiovascular:  Negative for chest pain, palpitations and leg swelling.  ? ? ?Objective:  ? ?Vitals:  ? 07/20/21 0922  ?BP: 140/80  ?Pulse: 91  ?SpO2: 98%  ?Weight: 175 lb 6.4 oz (79.6 kg)  ?Height: 5\' 6"  (1.676 m)  ? ?SpO2: 98 % ?O2 Device: None (Room air) ? ?Physical Exam: ?General: Well-appearing, no acute distress ?HENT: [place], AT ?Eyes: EOMI, no scleral icterus ?Respiratory: Diminished air entry. Bilateral wheezing and mild rhonchi. ?Cardiovascular: RRR, -M/R/G, no JVD ?Extremities:-Edema,-tenderness ?Neuro: AAO x4, CNII-XII grossly intact ?Psych: Normal mood, normal affect ? ?Data Reviewed: ? ?Imaging: ?CTA 10/06/2019-no pulmonary embolism.  No infiltrate, effusion or edema.  No parenchymal abnormalities ?CXR 06/20/2021-normal chest x-ray with no parenchymal abnormalities.  No infiltrate, effusion or edema. ?CXR 06/27/2021-normal chest x-ray with no parenchymal abnormalities.  No infiltrate effusion or edema. ? ?PFT: ?None on file ? ?Labs: ?CBC ?   ?Component Value Date/Time  ? WBC 11.8 (H) 07/04/2021 1055  ? RBC 4.69 07/04/2021 1055  ? HGB 13.7 07/04/2021 1055  ? HCT 41.6 07/04/2021 1055  ? PLT 403.0 (H) 07/04/2021 1055  ? MCV 88.6 07/04/2021 1055  ? MCH 28.5 06/26/2021 2312  ? MCHC 33.0 07/04/2021 1055  ? RDW 15.4 07/04/2021 1055  ? LYMPHSABS 2.1 07/04/2021 1055  ? MONOABS 0.6 07/04/2021 1055  ? EOSABS 0.9 (H) 07/04/2021 1055  ? BASOSABS 0.1 07/04/2021 1055  ? ?Absolute eos  ?06/26/21 - 1300 ?07/04/21 - 900 ?   ?IgE  ?07/04/21 628 ?Assessment & Plan:  ? ?Discussion: ?66 year old female with asthma, diabetes, hypothyroidism and reflux who presents for follow-up for asthma.  Labs with peripheral eosinophilia and elevated IgE. Well controlled  on medium dose ICS/LABA. Discussed clinical course and management of asthma including bronchodilator regimen and asthma action plan for exacerbation. ? ?Severe persistent asthma ?--CONTNUE Advair 250-50 mcg ONE puff TWICE a day ?--CONTINUE Albuterol AS NEEDED for shortness of breath or wheezing ?--CONTINUE Singulair 10 mg ONCE a day ? ?Asthma Action Plan ?Use albuterol for worsening shortness of breath, wheezing and cough. If you symptoms do not improve in 24-48 hours, please our office for evaluation and/or prednisone taper (prescribed for future use) ? ?  Health Maintenance ?Immunization History  ?Administered Date(s) Administered  ? Moderna Sars-Covid-2 Vaccination 08/02/2019, 08/30/2019  ? Tdap 07/09/2018  ? ?CT Lung Screen - not qualified  ? ?No orders of the defined types were placed in this encounter. ? ?Meds ordered this encounter  ?Medications  ? predniSONE (DELTASONE) 10 MG tablet  ?  Sig: Take 4 tablets (40 mg total) by mouth daily with breakfast for 2 days, THEN 3 tablets (30 mg total) daily with breakfast for 2 days, THEN 2 tablets (20 mg total) daily with breakfast for 2 days, THEN 1 tablet (10 mg total) daily with breakfast for 2 days.  ?  Dispense:  20 tablet  ?  Refill:  0  ? ? ?Return in about 5 months (around 12/20/2021). ? ?I have spent a total time of 32-minutes on the day of the appointment reviewing prior documentation, coordinating care and discussing medical diagnosis and plan with the patient/family. Past medical history, allergies, medications were reviewed. Pertinent imaging, labs and tests included in this note have been reviewed and interpreted independently by me. ? ? ?Marthella Osorno Mechele CollinJane Suhaib Guzzo, MD ?[facility] ?07/20/2021 11:18 AM  ?Office Number [phone] ? ? ?

## 2021-07-20 NOTE — Patient Instructions (Addendum)
Severe persistent asthma ?--CONTNUE Advair 250-50 mcg ONE puff TWICE a day ?--CONTINUE Albuterol AS NEEDED for shortness of breath or wheezing ?--CONTINUE Singulair 10 mg ONCE a day ?--Encourage regular aerobic activity ? ?Asthma Action Plan ?Use albuterol for worsening shortness of breath, wheezing and cough. If you symptoms do not improve in 24-48 hours, please our office for evaluation and/or prednisone taper. ? ?Follow-up with me in 5 months ? ? ?

## 2021-07-31 ENCOUNTER — Other Ambulatory Visit: Payer: Self-pay | Admitting: Pulmonary Disease

## 2021-09-18 ENCOUNTER — Other Ambulatory Visit: Payer: Self-pay | Admitting: Pulmonary Disease

## 2021-10-16 ENCOUNTER — Emergency Department (HOSPITAL_COMMUNITY)
Admission: EM | Admit: 2021-10-16 | Discharge: 2021-10-17 | Disposition: A | Discharge status: Home / Self Care | Payer: BC Managed Care – PPO | Attending: Emergency Medicine | Admitting: Emergency Medicine

## 2021-10-16 ENCOUNTER — Encounter (HOSPITAL_COMMUNITY): Payer: Self-pay | Admitting: Emergency Medicine

## 2021-10-16 DIAGNOSIS — E162 Hypoglycemia, unspecified: Secondary | ICD-10-CM | POA: Diagnosis present

## 2021-10-16 DIAGNOSIS — Z794 Long term (current) use of insulin: Secondary | ICD-10-CM | POA: Insufficient documentation

## 2021-10-16 DIAGNOSIS — R4182 Altered mental status, unspecified: Secondary | ICD-10-CM | POA: Diagnosis not present

## 2021-10-16 LAB — CBG MONITORING, ED: Glucose-Capillary: 92 mg/dL (ref 70–99)

## 2021-10-17 ENCOUNTER — Emergency Department (HOSPITAL_COMMUNITY): Payer: BC Managed Care – PPO

## 2021-10-17 LAB — CBC WITH DIFFERENTIAL/PLATELET
Abs Immature Granulocytes: 0.01 10*3/uL (ref 0.00–0.07)
Basophils Absolute: 0 10*3/uL (ref 0.0–0.1)
Basophils Relative: 0 %
Eosinophils Absolute: 0 10*3/uL (ref 0.0–0.5)
Eosinophils Relative: 0 %
HCT: 38.2 % (ref 36.0–46.0)
Hemoglobin: 12.4 g/dL (ref 12.0–15.0)
Immature Granulocytes: 0 %
Lymphocytes Relative: 15 %
Lymphs Abs: 1.4 10*3/uL (ref 0.7–4.0)
MCH: 29 pg (ref 26.0–34.0)
MCHC: 32.5 g/dL (ref 30.0–36.0)
MCV: 89.3 fL (ref 80.0–100.0)
Monocytes Absolute: 0.4 10*3/uL (ref 0.1–1.0)
Monocytes Relative: 4 %
Neutro Abs: 7.2 10*3/uL (ref 1.7–7.7)
Neutrophils Relative %: 81 %
Platelets: 358 10*3/uL (ref 150–400)
RBC: 4.28 MIL/uL (ref 3.87–5.11)
RDW: 15.1 % (ref 11.5–15.5)
WBC: 9.1 10*3/uL (ref 4.0–10.5)
nRBC: 0 % (ref 0.0–0.2)

## 2021-10-17 LAB — RAPID URINE DRUG SCREEN, HOSP PERFORMED
Amphetamines: NOT DETECTED
Barbiturates: NOT DETECTED
Benzodiazepines: NOT DETECTED
Cocaine: NOT DETECTED
Opiates: NOT DETECTED
Tetrahydrocannabinol: NOT DETECTED

## 2021-10-17 LAB — URINALYSIS, ROUTINE W REFLEX MICROSCOPIC
Bilirubin Urine: NEGATIVE
Glucose, UA: >=500 mg/dL — AB
Hgb urine dipstick: NEGATIVE
Ketones, ur: NEGATIVE mg/dL
Leukocytes,Ua: NEGATIVE
Nitrite: NEGATIVE
Protein, ur: NEGATIVE mg/dL
Specific Gravity, Urine: 1.015 (ref 1.005–1.030)
pH: 7 (ref 5.0–8.0)

## 2021-10-17 LAB — COMPREHENSIVE METABOLIC PANEL
ALT: 24 U/L (ref 0–44)
AST: 16 U/L (ref 15–41)
Albumin: 3.5 g/dL (ref 3.5–5.0)
Alkaline Phosphatase: 85 U/L (ref 38–126)
Anion gap: 7 (ref 5–15)
BUN: 10 mg/dL (ref 8–23)
CO2: 27 mmol/L (ref 22–32)
Calcium: 8.8 mg/dL — ABNORMAL LOW (ref 8.9–10.3)
Chloride: 106 mmol/L (ref 98–111)
Creatinine, Ser: 0.67 mg/dL (ref 0.44–1.00)
GFR, Estimated: >60 mL/min (ref >60)
Glucose, Bld: 218 mg/dL — ABNORMAL HIGH (ref 70–99)
Potassium: 3.9 mmol/L (ref 3.5–5.1)
Sodium: 140 mmol/L (ref 135–145)
Total Bilirubin: 0.6 mg/dL (ref 0.3–1.2)
Total Protein: 7.2 g/dL (ref 6.5–8.1)

## 2021-10-17 LAB — TROPONIN I (HIGH SENSITIVITY)
Troponin I (High Sensitivity): 5 ng/L (ref <18)
Troponin I (High Sensitivity): 6 ng/L (ref <18)

## 2021-10-17 LAB — CBG MONITORING, ED: Glucose-Capillary: 96 mg/dL (ref 70–99)

## 2021-10-17 LAB — PROTIME-INR
INR: 1 (ref 0.8–1.2)
Prothrombin Time: 13.2 seconds (ref 11.4–15.2)

## 2021-10-17 LAB — ETHANOL: Alcohol, Ethyl (B): <10 mg/dL (ref <10)

## 2021-10-17 MED ORDER — DEXTROSE 50 % IV SOLN
50.0000 mL | Freq: Once | INTRAVENOUS | Status: AC
  Administered 2021-10-16: 50 mL via INTRAVENOUS

## 2021-10-17 MED ORDER — GLUCOSE 40 % PO GEL
1.0000 | Freq: Once | ORAL | Status: AC
  Administered 2021-10-16: 31 g via ORAL

## 2021-10-19 DIAGNOSIS — E1165 Type 2 diabetes mellitus with hyperglycemia: Secondary | ICD-10-CM | POA: Insufficient documentation

## 2021-10-19 LAB — URINE CULTURE: Culture: >=100000 — AB

## 2021-10-20 ENCOUNTER — Telehealth: Payer: Self-pay

## 2021-10-20 NOTE — Progress Notes (Signed)
ED Antimicrobial Stewardship Positive Culture Follow Up   Dominique Snyder is an 66 y.o. female who presented to [facility] on 10/16/2021 with a chief complaint of  Chief Complaint  Patient presents with   Hypoglycemia    Recent Results (from the past 720 hour(s))  Urine Culture     Status: Abnormal   Collection Time: 10/17/21 12:00 AM   Specimen: Urine, Clean Catch  Result Value Ref Range Status   Specimen Description   Final    URINE, CLEAN CATCH Performed at [facility], [address] [place], [place] [address]    Special Requests   Final    NONE Performed at [facility], [address] [place], [place] [address]    Culture >=100,000 COLONIES/mL ESCHERICHIA COLI (A)  Final   Report Status 10/19/2021 FINAL  Final   Organism ID, Bacteria ESCHERICHIA COLI (A)  Final      Susceptibility   Escherichia coli - MIC*    AMPICILLIN 8 SENSITIVE Sensitive     CEFAZOLIN <=4 SENSITIVE Sensitive     CEFEPIME <=0.12 SENSITIVE Sensitive     CEFTRIAXONE <=0.25 SENSITIVE Sensitive     CIPROFLOXACIN <=0.25 SENSITIVE Sensitive     GENTAMICIN <=1 SENSITIVE Sensitive     IMIPENEM <=0.25 SENSITIVE Sensitive     NITROFURANTOIN <=16 SENSITIVE Sensitive     TRIMETH/SULFA <=20 SENSITIVE Sensitive     AMPICILLIN/SULBACTAM <=2 SENSITIVE Sensitive     PIP/TAZO <=4 SENSITIVE Sensitive     * >=100,000 COLONIES/mL ESCHERICHIA COLI    Patient discharged originally without antimicrobial agent.   New antibiotic prescription: Please call for symptom check (dysuria, urinary frequency, urinary urgency, fever, flank pain, confusion, etc). If patient is asymptomatic, no further treatment indicated. If patient reports symptoms of UTI, please start cephalexin 500mg  by mouth every 12 hours x 3 days.   ED Provider: Dr.  Benjiman Core 10/20/2021, 9:06 AM Clinical Pharmacist Monday - Friday phone -  [phone] Saturday - Sunday phone - [phone]

## 2021-10-20 NOTE — Telephone Encounter (Signed)
Post ED Visit - Positive Culture Follow-up  Culture report reviewed by antimicrobial stewardship pharmacist: [facility] [x]  Carla Jardin, .D. []  [place], Pharm.D., BCPS AQ-ID []  , Pharm.D., BCPS []  Celedonio Miyamoto, Pharm.D., BCPS []  [place], Garvin Fila.D., BCPS, AAHIVP []  , Pharm.D., BCPS, AAHIVP []  Georgina Pillion, PharmD, BCPS []  , PharmD, BCPS []  [place], PharmD, BCPS []  [address], PharmD []  , PharmD, BCPS []  Estella Husk, PharmD  Pharmacy Team []  Lysle Pearl, PharmD []  , PharmD []  Phillips Climes, PharmD []  , Rph []  Agapito Games) , PharmD []  Verlan Friends, PharmD []  , PharmD []  Mervyn Gay, PharmD []  , PharmD []  Vinnie Level, PharmD []  [facility], PharmD []  , PharmD []  Len Childs, PharmD   Positive urine culture Not treated per not no signs of symptoms  Plan: Call pt for symptom check if having symptoms start Cephalexin 500 mg po every 12 hours x 3 days per ED provider , MD. If no symptoms do not treat.   Spoke with pt and pt denies any urinary symptoms no medication started. No further follow-up needed.   Dominique Snyder 10/20/2021, 6:15 PM

## 2021-12-19 ENCOUNTER — Ambulatory Visit: Payer: BC Managed Care – PPO | Admitting: Pulmonary Disease

## 2022-01-02 ENCOUNTER — Other Ambulatory Visit: Payer: Self-pay | Admitting: Pulmonary Disease

## 2022-01-11 ENCOUNTER — Ambulatory Visit (INDEPENDENT_AMBULATORY_CARE_PROVIDER_SITE_OTHER): Payer: BC Managed Care – PPO | Admitting: Pulmonary Disease

## 2022-01-11 ENCOUNTER — Encounter: Payer: Self-pay | Admitting: Pulmonary Disease

## 2022-01-11 VITALS — BP 128/78 | HR 80 | Ht 66.0 in | Wt 190.4 lb

## 2022-01-11 DIAGNOSIS — Z23 Encounter for immunization: Secondary | ICD-10-CM | POA: Diagnosis not present

## 2022-01-11 DIAGNOSIS — J4550 Severe persistent asthma, uncomplicated: Secondary | ICD-10-CM

## 2022-01-11 MED ORDER — FLUTICASONE-SALMETEROL 250-50 MCG/ACT IN AEPB: 1.0000 | INHALATION_SPRAY | Freq: Two times a day (BID) | RESPIRATORY_TRACT | 5 refills | Status: DC

## 2022-01-11 NOTE — Patient Instructions (Signed)
Severe persistent asthma --CONTINUE Advair 250-50 mcg ONE puff TWICE a day --CONTINUE Albuterol AS NEEDED for shortness of breath or wheezing --CONTINUE Singulair 10 mg ONCE a day --Discuss vaccinations including COVID, influenza (UTD), RSV, pneumococcal (administer today)  Follow-up with me in 6 months

## 2022-01-11 NOTE — Progress Notes (Signed)
Subjective:   PATIENT ID: Dominique Snyder GENDER: female DOB: [birth year], MRN: [id number]   HPI  Chief Complaint  Patient presents with   Follow-up    asthma    Reason for Visit: Follow-up  Dominique Snyder is a 66 year old female with asthma, diabetes, hypothyroidism and reflux who presents for follow-up  Synopsis: She was diagnosed with COVID in November 2022 in the ED.  Did not require admission.  Since then she has returned to the ED at the end of February and the beginning of March for gradually worsening shortness of breath and wheezing requiring frequent albuterol use.  She was treated with nebulizers and steroids and magnesium.  Referred to outpatient pulmonary on 06/26/2021. She has tried [employer], materials twice. Using albuterol three times a day and nebulizer at home. She is having shortness of of breath and wheezing during the day and night. Never treated with steroids until she was seen ED.  07/20/21 Since our last visit she was started on Advair and treated for asthma exacerbation with prednisone taper. She uses albuterol twice a day. Denies frequent shortness of breath, cough or wheezing except when she is at work where she has dust exposure. She changes her mask often to reduce exposure.  01/11/22 Since our last visit she reports asthma is well controlled. Does use albuterol daily but not necessarily because she has symptoms but because workplace is dusty. Denies cough, wheezing or shortness of breath. She is not actively participating in exercise. At work she stands for 12 hours. She is planning to join a gym 1-2 times a week.  Asthma Control Test ACT Total Score  01/11/2022  1:36 PM 24  07/20/2021  9:23 AM 14  07/04/2021 10:17 AM 10   2023 Jan Feb Mar April May June July Aug Sept Oct Nov Dec    X X           2024 Jan Feb Mar April May June July Aug Sept Oct Nov Dec                2025 Jan Feb Mar April May June July Aug Sept Oct Nov Dec                 Social  History: Former smoker. 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  Smoked 2-3 per day.  It mostly just burned in the ash tray.  07/04/2021  hfb  Substance and Sexual Activity   Alcohol use: Never   Drug use: Never   Sexual activity: Not on file    Allergies  Allergen Reactions   Biotin    Iodinated Contrast Media Hives and Other (See Comments)    Shrimp   Other Other (See Comments)    Sinus medications Vanance Forte Berries   Strawberry (Diagnostic) Hives    "all berries"  Outpatient Medications Prior to Visit  Medication Sig Dispense Refill   albuterol (VENTOLIN HFA) 108 (90 Base) MCG/ACT inhaler albuterol sulfate HFA 90 mcg/actuation aerosol inhaler 8 g 3   atorvastatin (LIPITOR) 40 MG tablet Take 40 mg by mouth daily.     insulin glargine (LANTUS SOLOSTAR) 100 UNIT/ML Solostar Pen 40 Units daily.     levothyroxine (SYNTHROID) 100 MCG tablet levothyroxine 100 mcg tablet     metFORMIN (GLUCOPHAGE) 500 MG tablet 2 (two) times daily.     montelukast (SINGULAIR) 10 MG tablet Take 1 tablet (10 mg total) by mouth at bedtime. 30 tablet 11   nabumetone (RELAFEN) 750 MG tablet Take 750 mg by mouth as needed.     omeprazole (PRILOSEC) 20 MG capsule omeprazole 20 mg capsule,delayed release     sitaGLIPtin (JANUVIA) 100 MG tablet Januvia 100 mg tablet  TAKE 1 TABLET BY MOUTH EVERY DAY     fluticasone-salmeterol (ADVAIR) 250-50 MCG/ACT AEPB Inhale 1 puff into the lungs in the morning and  at bedtime. 60 each 5   atorvastatin (LIPITOR) 20 MG tablet atorvastatin 20 mg tablet     No facility-administered medications prior to visit.    Review of Systems  Constitutional:  Negative for chills, diaphoresis, fever, malaise/fatigue and weight loss.  HENT:  Negative for congestion.   Respiratory:  Negative for cough, hemoptysis, sputum production, shortness of breath and wheezing.   Cardiovascular:  Negative for chest pain, palpitations and leg swelling.     Objective:   Vitals:   01/11/22 1330  BP: 128/78  Pulse: 80  SpO2: 100%  Weight: 190 lb 6.4 oz (86.4 kg)  Height: 5\' 6"  (1.676 m)   SpO2: 100 % O2 Device: None (Room air)  Physical Exam: General: Well-appearing, no acute distress HENT: [place], AT Eyes: EOMI, no scleral icterus Respiratory: Clear to auscultation bilaterally.  No crackles, wheezing or rales Cardiovascular: RRR, -M/R/G, no JVD Extremities:-Edema,-tenderness Neuro: AAO x4, CNII-XII grossly intact Psych: Normal mood, normal affect  Data Reviewed:  Imaging: CTA 10/06/2019-no pulmonary embolism.  No infiltrate, effusion or edema.  No parenchymal abnormalities CXR 06/20/2021-normal chest x-ray with no parenchymal abnormalities.  No infiltrate, effusion or edema. CXR 06/27/2021-normal chest x-ray with no parenchymal abnormalities.  No infiltrate effusion or edema.  PFT: None on file  Labs: CBC    Component Value Date/Time   WBC 9.1 10/16/2021 2357   RBC 4.28 10/16/2021 2357   HGB 12.4 10/16/2021 2357   HCT 38.2 10/16/2021 2357   PLT 358 10/16/2021 2357   MCV 89.3 10/16/2021 2357   MCH 29.0 10/16/2021 2357   MCHC 32.5 10/16/2021 2357   RDW 15.1 10/16/2021 2357   LYMPHSABS 1.4 10/16/2021 2357   MONOABS 0.4 10/16/2021 2357   EOSABS 0.0 10/16/2021 2357   BASOSABS 0.0 10/16/2021 2357   Absolute eos  06/26/21 - 1300 07/04/21 - 900 10/16/21 - 0    IgE  07/04/21 628 Assessment & Plan:   Discussion: 66 year old with asthma, DM2, hypothyroidism  and reflux who presents for follow-up. Well controlled.  Last exacerbation 06/2021. Discussed clinical course and management of COPD including bronchodilator regimen, vaccinations, infection prevention and action plan for exacerbation.  Severe persistent asthma --CONTINUE Advair 250-50 mcg ONE puff TWICE a day --CONTINUE Albuterol AS NEEDED for shortness of breath or wheezing --CONTINUE Singulair 10 mg ONCE a day --Discuss vaccinations including COVID, influenza (UTD), RSV, pneumococcal (administer today)  Asthma Action Plan Use albuterol for worsening shortness of breath, wheezing and cough. If  you symptoms do not improve in 24-48 hours, please our office for evaluation and/or prednisone taper (prescribed for future use)  Health Maintenance Immunization History  Administered Date(s) Administered   Influenza,inj,quad, With Preservative 01/23/2019   Moderna Sars-Covid-2 Vaccination 08/02/2019, 08/30/2019   PNEUMOCOCCAL CONJUGATE-20 01/11/2022   Tdap 07/09/2018   CT Lung Screen - not qualified   Orders Placed This Encounter  Procedures   Pneumococcal conjugate vaccine 20-valent (Prevnar-20)   Meds ordered this encounter  Medications   fluticasone-salmeterol (ADVAIR) 250-50 MCG/ACT AEPB    Sig: Inhale 1 puff into the lungs in the morning and at bedtime.    Dispense:  60 each    Refill:  5    Return in about 6 months (around 07/12/2022).  I have spent a total time of 30-minutes on the day of the appointment including chart review, data review, collecting history, coordinating care and discussing medical diagnosis and plan with the patient/family. Past medical history, allergies, medications were reviewed. Pertinent imaging, labs and tests included in this note have been reviewed and interpreted independently by me.  Larnce Schnackenberg Mechele Collin, MD [facility] 01/11/2022 2:03 PM  Office Number [phone]

## 2022-07-02 ENCOUNTER — Other Ambulatory Visit: Payer: Self-pay | Admitting: Pulmonary Disease

## 2022-08-02 ENCOUNTER — Emergency Department (HOSPITAL_COMMUNITY)
Admission: EM | Admit: 2022-08-02 | Discharge: 2022-08-03 | Disposition: A | Discharge status: Home / Self Care | Attending: Emergency Medicine | Admitting: Emergency Medicine

## 2022-08-02 ENCOUNTER — Other Ambulatory Visit: Payer: Self-pay

## 2022-08-02 ENCOUNTER — Emergency Department (HOSPITAL_COMMUNITY): Payer: BC Managed Care – PPO

## 2022-08-02 DIAGNOSIS — S93402A Sprain of unspecified ligament of left ankle, initial encounter: Secondary | ICD-10-CM | POA: Insufficient documentation

## 2022-08-02 DIAGNOSIS — W1840XA Slipping, tripping and stumbling without falling, unspecified, initial encounter: Secondary | ICD-10-CM | POA: Diagnosis not present

## 2022-08-02 DIAGNOSIS — Y990 Civilian activity done for income or pay: Secondary | ICD-10-CM | POA: Insufficient documentation

## 2022-08-02 DIAGNOSIS — M25572 Pain in left ankle and joints of left foot: Secondary | ICD-10-CM | POA: Diagnosis present

## 2022-08-02 DIAGNOSIS — Y9301 Activity, walking, marching and hiking: Secondary | ICD-10-CM | POA: Diagnosis not present

## 2022-08-02 MED ORDER — IBUPROFEN 800 MG PO TABS
800.0000 mg | ORAL_TABLET | Freq: Once | ORAL | Status: AC
  Administered 2022-08-03: 800 mg via ORAL
  Filled 2022-08-02: qty 1

## 2022-08-02 NOTE — ED Triage Notes (Signed)
3 hours ago. Tripped on rug walking into work and twisted left ankle.  Pt complains of pain with walking. Ice provided in triage.

## 2022-08-03 NOTE — ED Provider Notes (Signed)
[place] EMERGENCY DEPARTMENT AT [facility] Provider Note   CSN: [id number] Arrival date & time: 08/02/22  2122     History  Chief Complaint  Patient presents with   Ankle Pain    Dominique Snyder is a 67 y.o. female.  Patient tripped while at work.  She states that her left lateral ankle and foot hurt.  Difficult to walk secondary to her.  She has sprained that ankle before.  No other injuries.   Ankle Pain      Home Medications Prior to Admission medications   Medication Sig Start Date End Date Taking? Authorizing Provider  albuterol (VENTOLIN HFA) 108 (90 Base) MCG/ACT inhaler albuterol sulfate HFA 90 mcg/actuation aerosol inhaler 07/04/21   Luciano Cutter, MD  atorvastatin (LIPITOR) 40 MG tablet Take 40 mg by mouth daily. 12/27/21   [provider]  fluticasone-salmeterol (ADVAIR) 250-50 MCG/ACT AEPB Inhale 1 puff into the lungs in the morning and at bedtime. 01/11/22   Luciano Cutter, MD  insulin glargine (LANTUS SOLOSTAR) 100 UNIT/ML Solostar Pen 40 Units daily.    [provider]  levothyroxine (SYNTHROID) 100 MCG tablet levothyroxine 100 mcg tablet    [provider]  metFORMIN (GLUCOPHAGE) 500 MG tablet 2 (two) times daily.    [provider]  montelukast (SINGULAIR) 10 MG tablet TAKE 1 TABLET BY MOUTH EVERYDAY AT BEDTIME 07/04/22   Luciano Cutter, MD  nabumetone (RELAFEN) 750 MG tablet Take 750 mg by mouth as needed.    [provider]  omeprazole (PRILOSEC) 20 MG capsule omeprazole 20 mg capsule,delayed release    [provider]  sitaGLIPtin (JANUVIA) 100 MG tablet Januvia 100 mg tablet  TAKE 1 TABLET BY MOUTH EVERY DAY    [provider]      Allergies    Biotin, Iodinated contrast media, Other, and Strawberry (diagnostic)    Review of Systems   Review of Systems  Physical Exam Updated Vital Signs BP (!) 179/99   Pulse 64   Temp 98.2 F (36.8 C) (Oral)   Resp 17   Ht 5\' 6"   (1.676 m)   Wt 81.6 kg   SpO2 99%   BMI 29.05 kg/m  Physical Exam Vitals and nursing note reviewed.  Constitutional:      Appearance: She is well-developed.  HENT:     Head: Normocephalic and atraumatic.     Mouth/Throat:     Mouth: Mucous membranes are moist.     Pharynx: Oropharynx is clear.  Cardiovascular:     Rate and Rhythm: Normal rate and regular rhythm.  Pulmonary:     Effort: No respiratory distress.     Breath sounds: No stridor.  Abdominal:     General: There is no distension.  Musculoskeletal:        General: Tenderness (Posterior to left lateral malleolus) present. No swelling or deformity. Normal range of motion.     Cervical back: Normal range of motion.  Skin:    General: Skin is warm and dry.  Neurological:     General: No focal deficit present.     Mental Status: She is alert.     ED Results / Procedures / Treatments   Labs (all labs ordered are listed, but only abnormal results are displayed) Labs Reviewed - No data to display  EKG None  Radiology DG Ankle Complete Left  Result Date: 08/02/2022 CLINICAL DATA:  Tripped, twisting injury EXAM: LEFT ANKLE COMPLETE - 3+ VIEW COMPARISON:  None Available. FINDINGS: There is no evidence of fracture, dislocation, or joint effusion. There is no evidence of arthropathy or other focal bone abnormality. Soft tissues are unremarkable. IMPRESSION: Negative. Electronically Signed   By: Charlett Nose M.D.   On: 08/02/2022 23:26    Procedures Procedures    Medications Ordered in ED Medications  ibuprofen (ADVIL) tablet 800 mg (800 mg Oral Given 08/03/22 0004)    ED Course/ Medical Decision Making/ A&P                             Medical Decision Making Amount and/or Complexity of Data Reviewed Radiology: ordered.  Risk Prescription drug management.   Likely ankle sprain. Cam boot provided. No proximal tibular ttp to suggest fracture. Pcp follow up if not improving.    Final Clinical Impression(s) /  ED Diagnoses Final diagnoses:  Sprain of left ankle, unspecified ligament, initial encounter    Rx / DC Orders ED Discharge Orders     None         Ahlijah Raia, Barbara Cower, MD 08/03/22 [id number]

## 2022-08-09 DIAGNOSIS — D509 Iron deficiency anemia, unspecified: Secondary | ICD-10-CM | POA: Insufficient documentation

## 2022-09-11 DIAGNOSIS — G8929 Other chronic pain: Secondary | ICD-10-CM | POA: Insufficient documentation

## 2023-02-18 ENCOUNTER — Other Ambulatory Visit: Payer: Self-pay | Admitting: Pulmonary Disease

## 2023-04-24 ENCOUNTER — Ambulatory Visit: Payer: BC Managed Care – PPO | Admitting: Primary Care

## 2023-06-06 ENCOUNTER — Ambulatory Visit: Payer: BC Managed Care – PPO | Admitting: Primary Care

## 2023-06-06 ENCOUNTER — Encounter: Payer: Self-pay | Admitting: Primary Care

## 2023-09-07 ENCOUNTER — Other Ambulatory Visit: Payer: Self-pay | Admitting: Pulmonary Disease

## 2023-09-18 ENCOUNTER — Ambulatory Visit (INDEPENDENT_AMBULATORY_CARE_PROVIDER_SITE_OTHER): Payer: Self-pay

## 2023-09-18 VITALS — BP 144/86 | HR 102 | Ht 68.0 in | Wt 190.1 lb

## 2023-09-18 DIAGNOSIS — K219 Gastro-esophageal reflux disease without esophagitis: Secondary | ICD-10-CM | POA: Insufficient documentation

## 2023-09-18 DIAGNOSIS — K769 Liver disease, unspecified: Secondary | ICD-10-CM | POA: Insufficient documentation

## 2023-09-18 DIAGNOSIS — E049 Nontoxic goiter, unspecified: Secondary | ICD-10-CM | POA: Insufficient documentation

## 2023-09-18 DIAGNOSIS — H04123 Dry eye syndrome of bilateral lacrimal glands: Secondary | ICD-10-CM | POA: Diagnosis not present

## 2023-09-18 DIAGNOSIS — N6019 Diffuse cystic mastopathy of unspecified breast: Secondary | ICD-10-CM | POA: Insufficient documentation

## 2023-09-18 DIAGNOSIS — E1121 Type 2 diabetes mellitus with diabetic nephropathy: Secondary | ICD-10-CM | POA: Insufficient documentation

## 2023-09-18 DIAGNOSIS — J309 Allergic rhinitis, unspecified: Secondary | ICD-10-CM | POA: Insufficient documentation

## 2023-09-18 DIAGNOSIS — E785 Hyperlipidemia, unspecified: Secondary | ICD-10-CM | POA: Insufficient documentation

## 2023-09-18 DIAGNOSIS — J410 Simple chronic bronchitis: Secondary | ICD-10-CM | POA: Insufficient documentation

## 2023-09-18 DIAGNOSIS — E663 Overweight: Secondary | ICD-10-CM | POA: Insufficient documentation

## 2023-09-18 DIAGNOSIS — J45909 Unspecified asthma, uncomplicated: Secondary | ICD-10-CM | POA: Insufficient documentation

## 2023-09-18 DIAGNOSIS — E039 Hypothyroidism, unspecified: Secondary | ICD-10-CM | POA: Insufficient documentation

## 2023-09-18 DIAGNOSIS — I10 Essential (primary) hypertension: Secondary | ICD-10-CM | POA: Insufficient documentation

## 2023-09-18 DIAGNOSIS — E042 Nontoxic multinodular goiter: Secondary | ICD-10-CM | POA: Insufficient documentation

## 2023-09-18 DIAGNOSIS — Z889 Allergy status to unspecified drugs, medicaments and biological substances status: Secondary | ICD-10-CM | POA: Insufficient documentation

## 2023-09-18 DIAGNOSIS — M5416 Radiculopathy, lumbar region: Secondary | ICD-10-CM | POA: Insufficient documentation

## 2023-09-18 DIAGNOSIS — J449 Chronic obstructive pulmonary disease, unspecified: Secondary | ICD-10-CM | POA: Insufficient documentation

## 2023-09-18 MED ORDER — CYCLOSPORINE 0.05 % OP EMUL
1.0000 [drp] | Freq: Two times a day (BID) | OPHTHALMIC | 11 refills | Status: AC
Start: 2023-09-18 — End: ?

## 2023-09-18 NOTE — Progress Notes (Signed)
 New Patient Office Visit  Subjective    Patient ID: Dominique Snyder, female    DOB: 01/15/1956  Age: 68 y.o. MRN: [id number]  CC:  Chief Complaint  Patient presents with   Establish Care    Pt here to establish care    HPI Dominique Snyder presents to establish care   Outpatient Encounter Medications as of 09/18/2023  Medication Sig   albuterol  (VENTOLIN  HFA) 108 (90 Base) MCG/ACT inhaler albuterol  sulfate HFA 90 mcg/actuation aerosol inhaler   atorvastatin (LIPITOR) 40 MG tablet Take 40 mg by mouth daily.   calcium-vitamin D (OSCAL WITH D) 500-5 MG-MCG tablet Take 1 tablet by mouth.   Continuous Glucose Sensor (FREESTYLE LIBRE 3 SENSOR) MISC by Does not apply route.   cycloSPORINE (RESTASIS) 0.05 % ophthalmic emulsion Place 1 drop into both eyes 2 (two) times daily.   ferrous sulfate 325 (65 FE) MG EC tablet Take 325 mg by mouth 3 (three) times daily with meals.   Flaxseed, Linseed, (FLAX SEED OIL PO) Take by mouth.   fluticasone -salmeterol (WIXELA INHUB) 250-50 MCG/ACT AEPB INHALE 1 PUFF INTO THE LUNGS IN THE MORNING AND AT BEDTIME.   Fluticasone -Umeclidin-Vilant (TRELEGY ELLIPTA) 200-62.5-25 MCG/ACT AEPB Inhale into the lungs.   insulin glargine (LANTUS SOLOSTAR) 100 UNIT/ML Solostar Pen 40 Units daily.   levothyroxine (SYNTHROID) 100 MCG tablet levothyroxine 100 mcg tablet   loratadine (CLARITIN) 10 MG tablet Take 10 mg by mouth daily.   metFORMIN (GLUCOPHAGE) 500 MG tablet 2 (two) times daily.   Multiple Vitamins-Minerals (ALIVE WOMENS 50+ COMPLETE MV PO) Take by mouth.   nabumetone (RELAFEN) 750 MG tablet Take 750 mg by mouth as needed.   omeprazole (PRILOSEC) 20 MG capsule omeprazole 20 mg capsule,delayed release   sitaGLIPtin (JANUVIA) 100 MG tablet Januvia 100 mg tablet  TAKE 1 TABLET BY MOUTH EVERY DAY   [DISCONTINUED] montelukast  (SINGULAIR ) 10 MG tablet TAKE 1 TABLET BY MOUTH EVERYDAY AT BEDTIME (Patient not taking: Reported on 09/18/2023)   No facility-administered  encounter medications on file as of 09/18/2023.    Past Medical History:  Diagnosis Date   Asthma    Diabetes mellitus without complication (HCC)    High cholesterol    Thyroid disease     Past Surgical History:  Procedure Laterality Date   BARTHOLIN GLAND CYST EXCISION      Family History  Problem Relation Age of Onset   Leukemia Mother    Asthma Neg Hx     Social History   Socioeconomic History   Marital status: Single    Spouse name: Not on file   Number of children: Not on file   Years of education: Not on file   Highest education level: Not on file  Occupational History   Not on file  Tobacco Use   Smoking status: Former    Current packs/day: 0.00    Types: Cigarettes    Start date: 05/25/1970    Quit date: 05/25/1982    Years since quitting: 41.3   Smokeless tobacco: Never   Tobacco comments:    Social smoker.  Smoked 2-3 per day.  It mostly just burned in the ash tray.  07/04/2021  hfb  Substance and Sexual Activity   Alcohol use: Never   Drug use: Never   Sexual activity: Not on file  Other Topics Concern   Not on file  Social History Narrative   Not on file   Social Drivers of Health   Financial Resource Strain: Not on  file  Food Insecurity: Not on file  Transportation Needs: Not on file  Physical Activity: Not on file  Stress: Not on file  Social Connections: Unknown (09/06/2021)   Received from [facility], [facility]   Social Network    Social Network: Not on file  Intimate Partner Violence: Unknown (07/29/2021)   Received from [facility], [facility]   HITS    Physically Hurt: Not on file    Insult or Talk Down To: Not on file    Threaten Physical Harm: Not on file    Scream or Curse: Not on file    ROS      Objective    BP (!) 144/86   Pulse (!) 102   Ht 5\' 8"  (1.727 m)   Wt 190 lb 1.9 oz (86.2 kg)   SpO2 98%   BMI 28.91 kg/m   Physical Exam Vitals and nursing note reviewed.  Constitutional:      Appearance:  Normal appearance.  HENT:     Head: Normocephalic.     Right Ear: Tympanic membrane, ear canal and external ear normal.     Left Ear: Tympanic membrane, ear canal and external ear normal.     Nose: Nose normal.     Mouth/Throat:     Mouth: Mucous membranes are moist.     Pharynx: Oropharynx is clear.  Eyes:     Extraocular Movements: Extraocular movements intact.     Pupils: Pupils are equal, round, and reactive to light.  Cardiovascular:     Rate and Rhythm: Normal rate and regular rhythm.  Pulmonary:     Effort: Pulmonary effort is normal.     Breath sounds: Normal breath sounds.  Musculoskeletal:     Cervical back: Normal range of motion and neck supple.  Skin:    General: Skin is warm and dry.  Neurological:     Mental Status: She is alert and oriented to person, place, and time.  Psychiatric:        Mood and Affect: Mood normal.        Thought Content: Thought content normal.         Assessment & Plan:   Problem List Items Addressed This Visit       Cardiovascular and Mediastinum   Benign hypertension   Her BP is elevated today,but she reports taking her BP medication this morning.   She is not certain what she takes, as she does not have it on her medication list.   She will call back to let us  know what she is taking.   Recommend a low sodium diet and regular exercise (goal of 150 minutes a week).   F/U in 2-3 months for recheck or sooner if systolic BP remains above 140.          Other   Dry eye syndrome of both eyes - Primary   Relevant Medications   cycloSPORINE (RESTASIS) 0.05 % ophthalmic emulsion    Return in about 3 months (around 12/19/2023).   Alison Irvine, FNP

## 2023-09-18 NOTE — Patient Instructions (Addendum)
 Recommend f/u in 3 months for labs.  Please call and let us  know what blood pressure medication you are taking.

## 2023-09-18 NOTE — Assessment & Plan Note (Signed)
 Well controlled with use of Restasis.  Refills provided.

## 2023-09-18 NOTE — Assessment & Plan Note (Signed)
 Her BP is elevated today,but she reports taking her BP medication this morning.   She is not certain what she takes, as she does not have it on her medication list.   She will call back to let us  know what she is taking.   Recommend a low sodium diet and regular exercise (goal of 150 minutes a week).   F/U in 2-3 months for recheck or sooner if systolic BP remains above 140.

## 2023-09-20 ENCOUNTER — Other Ambulatory Visit: Payer: Self-pay

## 2023-09-20 ENCOUNTER — Telehealth: Payer: Self-pay

## 2023-09-20 DIAGNOSIS — H2513 Age-related nuclear cataract, bilateral: Secondary | ICD-10-CM | POA: Diagnosis not present

## 2023-09-20 DIAGNOSIS — H16223 Keratoconjunctivitis sicca, not specified as Sjogren's, bilateral: Secondary | ICD-10-CM | POA: Diagnosis not present

## 2023-09-20 DIAGNOSIS — I10 Essential (primary) hypertension: Secondary | ICD-10-CM

## 2023-09-20 DIAGNOSIS — H402234 Chronic angle-closure glaucoma, bilateral, indeterminate stage: Secondary | ICD-10-CM | POA: Diagnosis not present

## 2023-09-20 DIAGNOSIS — H04123 Dry eye syndrome of bilateral lacrimal glands: Secondary | ICD-10-CM | POA: Diagnosis not present

## 2023-09-20 MED ORDER — AMLODIPINE BESYLATE 5 MG PO TABS
5.0000 mg | ORAL_TABLET | Freq: Every day | ORAL | Status: AC
Start: 2023-09-20 — End: ?

## 2023-09-20 NOTE — Telephone Encounter (Signed)
 Copied from CRM [id number]. Topic: General - Other >> Sep 19, 2023  3:23 PM Essie A wrote: Reason for CRM: Patient called to give Alison Irvine the name of her BP medication that she is taking.  It is Amlodepine Besylate 5 mg tablet, dosage is 1 tablet daily.

## 2023-09-24 DIAGNOSIS — H40031 Anatomical narrow angle, right eye: Secondary | ICD-10-CM | POA: Diagnosis not present

## 2023-10-04 DIAGNOSIS — H402234 Chronic angle-closure glaucoma, bilateral, indeterminate stage: Secondary | ICD-10-CM | POA: Diagnosis not present

## 2023-10-04 DIAGNOSIS — H04123 Dry eye syndrome of bilateral lacrimal glands: Secondary | ICD-10-CM | POA: Diagnosis not present

## 2023-11-21 ENCOUNTER — Other Ambulatory Visit: Payer: Self-pay | Admitting: Pulmonary Disease

## 2023-11-22 ENCOUNTER — Other Ambulatory Visit: Payer: Self-pay

## 2023-11-22 DIAGNOSIS — E119 Type 2 diabetes mellitus without complications: Secondary | ICD-10-CM

## 2023-11-22 DIAGNOSIS — J309 Allergic rhinitis, unspecified: Secondary | ICD-10-CM

## 2023-12-19 ENCOUNTER — Ambulatory Visit

## 2023-12-19 VITALS — BP 142/87 | HR 85 | Ht 68.0 in | Wt 188.0 lb

## 2023-12-19 DIAGNOSIS — G8929 Other chronic pain: Secondary | ICD-10-CM

## 2023-12-19 DIAGNOSIS — M25572 Pain in left ankle and joints of left foot: Secondary | ICD-10-CM | POA: Diagnosis not present

## 2023-12-19 DIAGNOSIS — J42 Unspecified chronic bronchitis: Secondary | ICD-10-CM

## 2023-12-19 DIAGNOSIS — M5416 Radiculopathy, lumbar region: Secondary | ICD-10-CM

## 2023-12-19 DIAGNOSIS — I10 Essential (primary) hypertension: Secondary | ICD-10-CM

## 2023-12-19 DIAGNOSIS — J4520 Mild intermittent asthma, uncomplicated: Secondary | ICD-10-CM

## 2023-12-19 MED ORDER — ALBUTEROL SULFATE HFA 108 (90 BASE) MCG/ACT IN AERS
INHALATION_SPRAY | RESPIRATORY_TRACT | 3 refills | Status: AC
Start: 2023-12-19 — End: ?

## 2023-12-19 MED ORDER — NABUMETONE 750 MG PO TABS
750.0000 mg | ORAL_TABLET | Freq: Two times a day (BID) | ORAL | 5 refills | Status: AC | PRN
Start: 2023-12-19 — End: ?

## 2023-12-19 MED ORDER — FLUTICASONE-SALMETEROL 250-50 MCG/ACT IN AEPB
1.0000 | INHALATION_SPRAY | Freq: Two times a day (BID) | RESPIRATORY_TRACT | 5 refills | Status: AC
Start: 2023-12-19 — End: ?

## 2023-12-19 NOTE — Progress Notes (Signed)
 Established Patient Office Visit  Subjective   Patient ID: Dominique Snyder, female    DOB: 1955/06/10  Age: 68 y.o. MRN: [id number]  Chief Complaint  Patient presents with   Medical Management of Chronic Issues    3 month follow up    HPI  Patient Active Problem List   Diagnosis Date Noted   Diabetic nephropathy associated with type 2 diabetes mellitus (HCC) 09/18/2023   Benign hypertension 09/18/2023   Allergic rhinitis 09/18/2023   Acquired hypothyroidism 09/18/2023   Asthma 09/18/2023   Lesion of liver 09/18/2023   Gastroesophageal reflux disease 09/18/2023   History of multiple allergies 09/18/2023   Hyperlipidemia 09/18/2023   Overweight with body mass index (BMI) 25.0-29.9 09/18/2023   Lumbar radiculopathy 09/18/2023   Multinodular goiter 09/18/2023   Fibrocystic disease of breast 09/18/2023   Goiter 09/18/2023   COPD (chronic obstructive pulmonary disease) (HCC) 09/18/2023   Dry eye syndrome of both eyes 09/18/2023   Chronic ankle pain 09/11/2022   Iron deficiency anemia 08/09/2022   Uncontrolled type 2 diabetes mellitus with hyperglycemia, with long-term current use of insulin (HCC) 10/19/2021   ROS    Objective:     BP (!) 142/87   Pulse 85   Ht 5' 8 (1.727 m)   Wt 188 lb (85.3 kg)   SpO2 96%   BMI 28.59 kg/m  BP Readings from Last 3 Encounters:  12/19/23 (!) 142/87  09/18/23 (!) 144/86  08/03/22 (!) 179/99   Wt Readings from Last 3 Encounters:  12/19/23 188 lb (85.3 kg)  09/18/23 190 lb 1.9 oz (86.2 kg)  08/02/22 180 lb (81.6 kg)     Physical Exam Vitals and nursing note reviewed.  Constitutional:      Appearance: Normal appearance.  HENT:     Head: Normocephalic.  Eyes:     Extraocular Movements: Extraocular movements intact.     Pupils: Pupils are equal, round, and reactive to light.  Cardiovascular:     Rate and Rhythm: Normal rate and regular rhythm.  Pulmonary:     Effort: Pulmonary effort is normal.     Breath sounds: Normal  breath sounds.  Musculoskeletal:     Cervical back: Normal range of motion and neck supple.  Neurological:     Mental Status: She is alert and oriented to person, place, and time.  Psychiatric:        Mood and Affect: Mood normal.        Thought Content: Thought content normal.    No results found for any visits on 12/19/23.    The ASCVD Risk score (Arnett DK, et al., 2019) failed to calculate for the following reasons:   Cannot find a previous HDL lab   Cannot find a previous total cholesterol lab    Assessment & Plan:   Problem List Items Addressed This Visit       Cardiovascular and Mediastinum   Benign hypertension - Primary    BP elevated, likely d/t missed dose of amlodipine .  Continue with current dose.  Recommend a low-sodium diet and regular exercise to help maintain good control of BP.         Respiratory   Asthma   Her symptoms worsened by heat. Requires inhaler refills. - Refill Wixela and albuterol  inhalers.      Relevant Medications   albuterol  (VENTOLIN  HFA) 108 (90 Base) MCG/ACT inhaler   fluticasone -salmeterol (WIXELA INHUB) 250-50 MCG/ACT AEPB     Nervous and Auditory   Lumbar radiculopathy  Will refill Relafen  for chronic lumbar pain.       Relevant Medications   nabumetone  (RELAFEN ) 750 MG tablet     Other   Chronic ankle pain   Will refill Relafen  for chronic lumbar pain.       Relevant Medications   nabumetone  (RELAFEN ) 750 MG tablet        No follow-ups on file.    Leita Longs, FNP

## 2023-12-19 NOTE — Patient Instructions (Signed)
 Recommend Voltaren gel for left ankle pain and wearing an ankle brace.

## 2023-12-23 NOTE — Assessment & Plan Note (Signed)
 Will refill Relafen  for chronic lumbar pain.

## 2023-12-23 NOTE — Assessment & Plan Note (Signed)
 BP elevated, likely d/t missed dose of amlodipine .  Continue with current dose.  Recommend a low-sodium diet and regular exercise to help maintain good control of BP.

## 2023-12-23 NOTE — Assessment & Plan Note (Signed)
 Her symptoms worsened by heat. Requires inhaler refills. - Refill Wixela and albuterol  inhalers.

## 2023-12-23 NOTE — Assessment & Plan Note (Addendum)
 Dominique Snyder

## 2024-01-07 DIAGNOSIS — H04123 Dry eye syndrome of bilateral lacrimal glands: Secondary | ICD-10-CM | POA: Diagnosis not present

## 2024-01-07 DIAGNOSIS — H402234 Chronic angle-closure glaucoma, bilateral, indeterminate stage: Secondary | ICD-10-CM | POA: Diagnosis not present

## 2024-02-21 DIAGNOSIS — H40032 Anatomical narrow angle, left eye: Secondary | ICD-10-CM | POA: Diagnosis not present

## 2024-03-14 ENCOUNTER — Ambulatory Visit (HOSPITAL_BASED_OUTPATIENT_CLINIC_OR_DEPARTMENT_OTHER): Payer: Self-pay | Admitting: Pulmonary Disease

## 2024-03-14 ENCOUNTER — Ambulatory Visit (HOSPITAL_BASED_OUTPATIENT_CLINIC_OR_DEPARTMENT_OTHER): Admitting: Pulmonary Disease

## 2024-03-14 NOTE — Telephone Encounter (Signed)
 Appt made for 03/17/2024

## 2024-03-14 NOTE — Telephone Encounter (Signed)
 FYI Only or Action Required?: FYI only for provider: transferred to PAS to reschedule appointment with PAS-no acute symptoms at this time.  Patient is followed in Pulmonology for asthma, last seen on 01/11/2022 by Kassie Acquanetta Bradley, MD.  Called Nurse Triage reporting Cough.  Symptoms began today.  Interventions attempted: Rescue inhaler and Maintenance inhaler.  Symptoms are: completely resolved.  Triage Disposition: No disposition on file.  Patient/caregiver understands and will follow disposition?:  E2C2 Pulmonary Triage - Initial Assessment Questions "Chief Complaint (e.g., cough, sob, wheezing, fever, chills, sweat or additional symptoms) *Go to specific symptom protocol after initial questions. Patient reports shortness of breath and coughing started while she running around trying to get her keys. Patient states after using her rescue inhaler, symptoms subsided. No CP, fever, wheezing, chills or sweats. Patient denies acute symptoms at time of phone call. Reports feeling good and doesn't think she needs to be seen acutely.   Patient did miss her appointment today-got confused with times. Patient is asking to be rescheduled to see Dr. Kassie. Patient is transferred to PAS to reschedule office visit.   "How long have symptoms been present?" Started today and improved with using her inhaler.   Have you tested for COVID or Flu? Note: If not, ask patient if a home test can be taken. If so, instruct patient to call back for positive results. No  MEDICINES:   "Have you used any OTC meds to help with symptoms?" No If yes, ask "What medications?" no  "Have you used your inhalers/maintenance medication?" Yes If yes, "What medications?" Albuterol  Wixela  If inhaler, ask "How many puffs and how often?" Note: Review instructions on medication in the chart. Albuterol  inhaler Wixela 1 puff BID OXYGEN: "Do you wear supplemental oxygen?" No If yes, "How many liters are you supposed to  use?"   "Do you monitor your oxygen levels?" No If yes, What is your reading (oxygen level) today?   What is your usual oxygen saturation reading?  (Note: Pulmonary O2 sats should be 90% or greater)   Copied from CRM #[id number]. Topic: Clinical - Red Word Triage >> Mar 14, 2024 10:08 AM Benton KIDD wrote: [facility] that prompted transfer to Nurse Triage: coughing and shortness of breath Was transferred from primary to get rescheduled for her appointment she missed today but in the time on the line she was doing a lot of coughing and shortness of breath  Dr kassie in [place] Reason for Disposition  Cough  Answer Assessment - Initial Assessment Questions 7. CARDIAC HISTORY: Do you have any history of heart disease? (e.g., heart attack, congestive heart failure)      no 8. LUNG HISTORY: Do you have any history of lung disease?  (e.g., pulmonary embolus, asthma, emphysema)     yes 12. TRAVEL: Have you traveled out of the country in the last month? (e.g., travel history, exposures)       no  Protocols used: Cough - Acute Non-Productive-A-AH

## 2024-03-17 ENCOUNTER — Encounter (HOSPITAL_BASED_OUTPATIENT_CLINIC_OR_DEPARTMENT_OTHER): Payer: Self-pay | Admitting: Pulmonary Disease

## 2024-03-17 ENCOUNTER — Ambulatory Visit (HOSPITAL_BASED_OUTPATIENT_CLINIC_OR_DEPARTMENT_OTHER): Admitting: Pulmonary Disease

## 2024-03-17 VITALS — BP 122/58 | HR 89 | Temp 98.5°F | Ht 68.0 in | Wt 188.4 lb

## 2024-03-17 DIAGNOSIS — Z87891 Personal history of nicotine dependence: Secondary | ICD-10-CM

## 2024-03-17 DIAGNOSIS — J4551 Severe persistent asthma with (acute) exacerbation: Secondary | ICD-10-CM

## 2024-03-17 LAB — POCT INFLUENZA A/B
Influenza A, POC: NEGATIVE
Influenza B, POC: NEGATIVE

## 2024-03-17 LAB — POC COVID19 BINAXNOW: SARS Coronavirus 2 Ag: NEGATIVE

## 2024-03-17 MED ORDER — PREDNISONE 10 MG PO TABS: ORAL_TABLET | ORAL | 0 refills | Status: AC

## 2024-03-17 NOTE — Patient Instructions (Signed)
 Severe persistent asthma with acute exacerbation (HCC) --START prednisone  taper --CONTINUE Wixela 250-50 mcg ONE puff TWICE a day --CONTINUE Albuterol  AS NEEDED for shortness of breath or wheezing --Off singulair  due to dizziness

## 2024-03-17 NOTE — Assessment & Plan Note (Signed)
--  START prednisone  taper --CONTINUE Wixela 250-50 mcg ONE puff TWICE a day --CONTINUE Albuterol  AS NEEDED for shortness of breath or wheezing --Off singulair  due to dizziness  Asthma Action Plan Use albuterol  for worsening shortness of breath, wheezing and cough. If you symptoms do not improve in 24-48 hours, please our office for evaluation and/or prednisone  taper (prescribed for future use)

## 2024-03-17 NOTE — Progress Notes (Signed)
 Subjective:   PATIENT ID: [name] GENDER: female DOB: [birth year], MRN: [id number]   HPI  Chief Complaint  Patient presents with   Asthma    Reason for Visit: Follow-up  Ms. Dominique Snyder is a 68 year old female with asthma, diabetes, hypothyroidism and reflux who presents for follow-up  Synopsis: She was diagnosed with COVID in November 2022 in the ED.  Did not require admission.  Since then she has returned to the ED at the end of February and the beginning of March for gradually worsening shortness of breath and wheezing requiring frequent albuterol  use.  She was treated with nebulizers and steroids and magnesium .  Referred to outpatient pulmonary on 06/26/2021. She has tried Breo twice. Using albuterol  three times a day and nebulizer at home. She is having shortness of of breath and wheezing during the day and night. Never treated with steroids until she was seen ED.  07/20/21 Since our last visit she was started on Advair and treated for asthma exacerbation with prednisone  taper. She uses albuterol  twice a day. Denies frequent shortness of breath, cough or wheezing except when she is at work where she has dust exposure. She changes her mask often to reduce exposure.  01/11/22 Since our last visit she reports asthma is well controlled. Does use albuterol  daily but not necessarily because she has symptoms but because workplace is dusty. Denies cough, wheezing or shortness of breath. She is not actively participating in exercise. At work she stands for 12 hours. She is planning to join a gym 1-2 times a week.  03/17/24 She has been lost to follow-up since 2023. She reports sore scratchy throat and shortness of breath, coughing, wheezing and worsened when eating things. Taking cough drops. Reports fever and sweats two days ago. Reports chills and muscle aches. Denies recent sick contacts. Prior to Saturday she reports she was doing well on Wixela. Now using albuterol  2-3 times a  day  Asthma Control Test ACT Total Score  03/17/2024 11:06 AM 15  01/11/2022  1:36 PM 24  07/20/2021  9:23 AM 14   2023 Jan Feb Mar April May June July Aug Sept Oct Nov Dec    X X           2024 Jan Feb Mar April May June July Aug Sept Oct Nov Dec                2025 Jan Feb Mar April May June July Aug Sept Oct Nov Dec             X    Social History: Former smoker. 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  Smoked 2-3 per day.  It mostly just burned in the ash tray.  07/04/2021  hfb  Substance and Sexual Activity  Alcohol use: Never   Drug use: Never   Sexual activity: Not on file    Allergies  Allergen Reactions   Biotin    Iodinated Contrast Media Hives and Other (See Comments)    Shrimp   Other Other (See Comments)    Sinus medications [place] Forte Berries   Singulair  [Montelukast ]     Dizziness   Strawberry (Diagnostic) Hives    all berries     Outpatient Medications Prior to Visit  Medication Sig Dispense Refill   albuterol  (VENTOLIN  HFA) 108 (90 Base) MCG/ACT inhaler albuterol  sulfate HFA 90 mcg/actuation aerosol inhaler 8 g 3   amLODipine  (NORVASC ) 5 MG tablet Take 1 tablet (5 mg total) by mouth daily.     atorvastatin (LIPITOR) 40 MG tablet Take 40 mg by mouth daily.     calcium-vitamin D (OSCAL WITH D) 500-5 MG-MCG tablet Take 1 tablet by mouth.     Continuous Glucose Sensor (FREESTYLE LIBRE 3 SENSOR) MISC by Does not  apply route.     cycloSPORINE  (RESTASIS ) 0.05 % ophthalmic emulsion Place 1 drop into both eyes 2 (two) times daily. 60 each 11   ferrous sulfate 325 (65 FE) MG EC tablet Take 325 mg by mouth 3 (three) times daily with meals.     Flaxseed, Linseed, (FLAX SEED OIL PO) Take by mouth.     fluticasone -salmeterol (WIXELA INHUB) 250-50 MCG/ACT AEPB Inhale 1 puff into the lungs 2 (two) times daily. in the morning and at bedtime. 60 each 5   insulin glargine (LANTUS SOLOSTAR) 100 UNIT/ML Solostar Pen INJECT 50 UNITS ONCE A DAY SUBCUTANEOUS 15 mL 2   levothyroxine (SYNTHROID) 100 MCG tablet levothyroxine 100 mcg tablet     loratadine (CLARITIN) 10 MG tablet TAKE 1 TABLET BY MOUTH EVERY DAY 90 tablet 4   metFORMIN (GLUCOPHAGE) 500 MG tablet 2 (two) times daily.     Multiple Vitamins-Minerals (ALIVE WOMENS 50+ COMPLETE MV PO) Take by mouth.     nabumetone  (RELAFEN ) 750 MG tablet Take 1 tablet (750 mg total) by mouth 2 (two) times daily as needed for moderate pain (pain score 4-6). 60 tablet 5   omeprazole (PRILOSEC) 20 MG capsule omeprazole 20 mg capsule,delayed release     sitaGLIPtin (JANUVIA) 100 MG tablet Januvia 100 mg tablet  TAKE 1 TABLET BY MOUTH EVERY DAY     No facility-administered medications prior to visit.    Review of Systems  Constitutional:  Negative for chills, diaphoresis, fever, malaise/fatigue and weight loss.  HENT:  Negative for congestion.   Respiratory:  Positive for cough, sputum production, shortness of breath and wheezing. Negative for hemoptysis.   Cardiovascular:  Negative for chest pain, palpitations and leg swelling.     Objective:   Vitals:   03/17/24 1059  BP: (!) 149/95  Pulse: 89  Temp: 98.5 F (36.9 C)  SpO2: (!) 88%  Weight: 188 lb 6.4 oz (85.5 kg)  Height: 5' 8 (1.727 m)   SpO2: (!) 88 %  Physical Exam: General: Well-appearing, no acute distress HENT: [place], AT Eyes: EOMI, no scleral icterus Respiratory: Clear to auscultation bilaterally.  No  crackles, wheezing or rales Cardiovascular: RRR, -M/R/G, no JVD Extremities:-Edema,-tenderness Neuro: AAO x4, CNII-XII grossly intact Psych: Normal mood, normal affect  Data Reviewed:  Imaging: CTA 10/06/2019-no pulmonary embolism.  No infiltrate, effusion or edema.  No parenchymal abnormalities CXR 06/20/2021-normal chest x-ray with no parenchymal abnormalities.  No infiltrate, effusion or edema. CXR 06/27/2021-normal chest x-ray with no parenchymal abnormalities.  No  infiltrate effusion or edema.  PFT: None on file  Labs: CBC    Component Value Date/Time   WBC 9.1 10/16/2021 2357   RBC 4.28 10/16/2021 2357   HGB 12.4 10/16/2021 2357   HCT 38.2 10/16/2021 2357   PLT 358 10/16/2021 2357   MCV 89.3 10/16/2021 2357   MCH 29.0 10/16/2021 2357   MCHC 32.5 10/16/2021 2357   RDW 15.1 10/16/2021 2357   LYMPHSABS 1.4 10/16/2021 2357   MONOABS 0.4 10/16/2021 2357   EOSABS 0.0 10/16/2021 2357   BASOSABS 0.0 10/16/2021 2357   Absolute eos  06/26/21 - 1300 07/04/21 - 900 10/16/21 - 0    IgE  07/04/21 628 Assessment & Plan:   Discussion: 68 year old female with asthma, DM2, hypothyroidism and reflux who presents for follow-up. Currently in exacerbation. Previously well controlled on ICS/LABA. Flu/covid negative. Asthma exacerbation secondary to URI.  Assessment & Plan Severe persistent asthma with acute exacerbation (HCC) --START prednisone  taper --CONTINUE Wixela 250-50 mcg ONE puff TWICE a day --CONTINUE Albuterol  AS NEEDED for shortness of breath or wheezing --Off singulair  due to dizziness  Asthma Action Plan Use albuterol  for worsening shortness of breath, wheezing and cough. If you symptoms do not improve in 24-48 hours, please our office for evaluation and/or prednisone  taper (prescribed for future use)   Health Maintenance Immunization History  Administered Date(s) Administered   Influenza,inj,quad, With Preservative 01/23/2019   Moderna Sars-Covid-2 Vaccination  08/02/2019, 08/30/2019   PNEUMOCOCCAL CONJUGATE-20 01/11/2022   Tdap 07/09/2018   CT Lung Screen - not qualified   No orders of the defined types were placed in this encounter.  Meds ordered this encounter  Medications   predniSONE  (DELTASONE ) 10 MG tablet    Sig: Take 4 tablets (40 mg total) by mouth daily with breakfast for 2 days, THEN 3 tablets (30 mg total) daily with breakfast for 2 days, THEN 2 tablets (20 mg total) daily with breakfast for 2 days, THEN 1 tablet (10 mg total) daily with breakfast for 2 days.    Dispense:  20 tablet    Refill:  0    Return in about 6 months (around 09/14/2024).  I have spent a total time of 35-minutes on the day of the appointment including chart review, data review, collecting history, coordinating care and discussing medical diagnosis and plan with the patient/family. Past medical history, allergies, medications were reviewed. Pertinent imaging, labs and tests included in this note have been reviewed and interpreted independently by me.  Pernell Lenoir Slater Staff, MD [facility] 03/17/2024 11:31 AM

## 2024-04-21 ENCOUNTER — Other Ambulatory Visit: Payer: Self-pay

## 2024-04-21 DIAGNOSIS — J309 Allergic rhinitis, unspecified: Secondary | ICD-10-CM

## 2024-04-21 MED ORDER — LORATADINE 10 MG PO TABS: 10.0000 mg | ORAL_TABLET | Freq: Every day | ORAL | 4 refills | Status: AC

## 2024-04-21 NOTE — Telephone Encounter (Signed)
 Copied from CRM #[id number]. Topic: Clinical - Medication Refill >> Apr 21, 2024 10:19 AM Lonell PEDLAR wrote: Medication: loratadine (CLARITIN) 10 MG tablet  Has the patient contacted their pharmacy? Yes, advised pt she has no refills until February   This is the patient's preferred pharmacy:  [id number] - [place] [place] - [address] AT [facility] [address]  [place] [address] Phone: [phone] Fax: [phone]  Is this the correct pharmacy for this prescription? Yes If no, delete pharmacy and type the correct one.   Has the prescription been filled recently? Yes  Is the patient out of the medication? Yes  Has the patient been seen for an appointment in the last year OR does the patient have an upcoming appointment? Yes  Can we respond through MyChart? Yes  Agent: Please be advised that Rx refills may take up to 3 business days. We ask that you follow-up with your pharmacy.

## 2024-05-02 ENCOUNTER — Ambulatory Visit: Payer: Self-pay

## 2024-05-02 ENCOUNTER — Telehealth: Payer: Self-pay

## 2024-05-02 NOTE — Telephone Encounter (Signed)
 Unable to reach patient for triage x 3 attempts.

## 2024-05-02 NOTE — Telephone Encounter (Signed)
 2nd CB attempt. LVM to call office back.    Copied from CRM #[id number]. Topic: Clinical - Medical Advice >> May 02, 2024  3:01 PM Santiya F wrote: Reason for CRM: Patient is calling in because she has been having movement in her stomach. Patient says she notices that when she walks, that she walks crooked. Patient says in [place] she was seeing someone about her balance and would like to see another specialist as well. She says she isn't sure what the movement in her stomach is, but she would like to speak with someone regarding it.

## 2024-05-02 NOTE — Telephone Encounter (Signed)
 Copied from CRM #[id number]. Topic: Clinical - Prescription Issue >> May 02, 2024  2:59 PM Travis F wrote: Reason for CRM: Patient is calling in because she has a prescription for Benazepril  at the pharmacy and she wants to know if her provider can send in a new [facility] [address] in [place].

## 2024-05-02 NOTE — Telephone Encounter (Signed)
 1st CB attempt. LVM to call office back.     Copied from CRM #[id number]. Topic: Clinical - Medical Advice >> May 02, 2024  3:01 PM Dominique Snyder wrote: Reason for CRM: Patient is calling in because she has been having movement in her stomach. Patient says she notices that when she walks, that she walks crooked. Patient says in [place] she was seeing someone about her balance and would like to see another specialist as well. She says she isn't sure what the movement in her stomach is, but she would like to speak with someone regarding it.

## 2024-05-05 ENCOUNTER — Other Ambulatory Visit: Payer: Self-pay

## 2024-05-05 DIAGNOSIS — I10 Essential (primary) hypertension: Secondary | ICD-10-CM

## 2024-05-05 MED ORDER — BENAZEPRIL HCL 10 MG PO TABS: 10.0000 mg | ORAL_TABLET | Freq: Every day | ORAL | 1 refills | Status: AC

## 2024-05-05 NOTE — Telephone Encounter (Signed)
 Patient advised.

## 2024-05-05 NOTE — Telephone Encounter (Signed)
 [facility]

## 2024-05-21 ENCOUNTER — Ambulatory Visit: Payer: Self-pay

## 2024-05-27 ENCOUNTER — Ambulatory Visit: Payer: Self-pay

## 2024-05-30 ENCOUNTER — Ambulatory Visit: Payer: Self-pay

## 2024-05-30 NOTE — Telephone Encounter (Signed)
 This encounter was created in error - please disregard.

## 2024-05-30 NOTE — Telephone Encounter (Signed)
 Patient calling back in stating that she does not want to see another provider. She would only like to see her PCP. Attempted to call office to inquire about scheduling with no answer. Patient is requesting to cancel the appt made for 2/10.

## 2024-06-03 ENCOUNTER — Ambulatory Visit: Admitting: Internal Medicine
